# Patient Record
Sex: Female | Born: 1935 | Race: White | Hispanic: No | Smoking: Former smoker
Health system: Southern US, Community
[De-identification: ages and names within clinical notes are randomized; demographics above are authoritative.]

## PROBLEM LIST (undated history)

## (undated) DIAGNOSIS — F32A Depression, unspecified: Secondary | ICD-10-CM

## (undated) DIAGNOSIS — J690 Pneumonitis due to inhalation of food and vomit: Secondary | ICD-10-CM

## (undated) DIAGNOSIS — R079 Chest pain, unspecified: Secondary | ICD-10-CM

## (undated) DIAGNOSIS — G9340 Encephalopathy, unspecified: Secondary | ICD-10-CM

## (undated) DIAGNOSIS — H9190 Unspecified hearing loss, unspecified ear: Secondary | ICD-10-CM

## (undated) DIAGNOSIS — I10 Essential (primary) hypertension: Secondary | ICD-10-CM

## (undated) DIAGNOSIS — F329 Major depressive disorder, single episode, unspecified: Secondary | ICD-10-CM

## (undated) DIAGNOSIS — C801 Malignant (primary) neoplasm, unspecified: Secondary | ICD-10-CM

## (undated) DIAGNOSIS — H548 Legal blindness, as defined in USA: Secondary | ICD-10-CM

## (undated) HISTORY — PX: SHOULDER SURGERY: SHX246

## (undated) HISTORY — PX: ABDOMINAL HYSTERECTOMY: SHX81

---

## 2017-06-09 ENCOUNTER — Inpatient Hospital Stay (HOSPITAL_BASED_OUTPATIENT_CLINIC_OR_DEPARTMENT_OTHER)
Admission: EM | Admit: 2017-06-09 | Discharge: 2017-06-12 | DRG: 070 | Disposition: A | Discharge status: Skilled Nursing Facility | Payer: Medicare Other | Attending: Nephrology | Admitting: Nephrology

## 2017-06-09 ENCOUNTER — Emergency Department (HOSPITAL_BASED_OUTPATIENT_CLINIC_OR_DEPARTMENT_OTHER): Payer: Medicare Other

## 2017-06-09 ENCOUNTER — Encounter (HOSPITAL_BASED_OUTPATIENT_CLINIC_OR_DEPARTMENT_OTHER): Payer: Self-pay | Admitting: Emergency Medicine

## 2017-06-09 ENCOUNTER — Other Ambulatory Visit: Payer: Self-pay

## 2017-06-09 DIAGNOSIS — R4182 Altered mental status, unspecified: Secondary | ICD-10-CM

## 2017-06-09 DIAGNOSIS — J690 Pneumonitis due to inhalation of food and vomit: Secondary | ICD-10-CM

## 2017-06-09 DIAGNOSIS — F329 Major depressive disorder, single episode, unspecified: Secondary | ICD-10-CM | POA: Diagnosis present

## 2017-06-09 DIAGNOSIS — I10 Essential (primary) hypertension: Secondary | ICD-10-CM | POA: Diagnosis present

## 2017-06-09 DIAGNOSIS — H532 Diplopia: Secondary | ICD-10-CM | POA: Diagnosis not present

## 2017-06-09 DIAGNOSIS — R0602 Shortness of breath: Secondary | ICD-10-CM

## 2017-06-09 DIAGNOSIS — G9341 Metabolic encephalopathy: Secondary | ICD-10-CM | POA: Diagnosis not present

## 2017-06-09 DIAGNOSIS — R410 Disorientation, unspecified: Secondary | ICD-10-CM | POA: Diagnosis present

## 2017-06-09 DIAGNOSIS — Z79899 Other long term (current) drug therapy: Secondary | ICD-10-CM

## 2017-06-09 DIAGNOSIS — R440 Auditory hallucinations: Secondary | ICD-10-CM | POA: Diagnosis present

## 2017-06-09 DIAGNOSIS — Z87891 Personal history of nicotine dependence: Secondary | ICD-10-CM

## 2017-06-09 DIAGNOSIS — N390 Urinary tract infection, site not specified: Secondary | ICD-10-CM | POA: Diagnosis present

## 2017-06-09 DIAGNOSIS — N3000 Acute cystitis without hematuria: Secondary | ICD-10-CM

## 2017-06-09 DIAGNOSIS — Z66 Do not resuscitate: Secondary | ICD-10-CM | POA: Diagnosis not present

## 2017-06-09 DIAGNOSIS — Z8673 Personal history of transient ischemic attack (TIA), and cerebral infarction without residual deficits: Secondary | ICD-10-CM | POA: Diagnosis not present

## 2017-06-09 DIAGNOSIS — R441 Visual hallucinations: Secondary | ICD-10-CM | POA: Diagnosis not present

## 2017-06-09 DIAGNOSIS — R8271 Bacteriuria: Secondary | ICD-10-CM | POA: Diagnosis present

## 2017-06-09 DIAGNOSIS — R2689 Other abnormalities of gait and mobility: Secondary | ICD-10-CM | POA: Diagnosis present

## 2017-06-09 DIAGNOSIS — F419 Anxiety disorder, unspecified: Secondary | ICD-10-CM | POA: Diagnosis present

## 2017-06-09 DIAGNOSIS — G319 Degenerative disease of nervous system, unspecified: Secondary | ICD-10-CM | POA: Diagnosis not present

## 2017-06-09 DIAGNOSIS — E876 Hypokalemia: Secondary | ICD-10-CM | POA: Diagnosis not present

## 2017-06-09 DIAGNOSIS — F32A Depression, unspecified: Secondary | ICD-10-CM

## 2017-06-09 HISTORY — DX: Malignant (primary) neoplasm, unspecified: C80.1

## 2017-06-09 HISTORY — DX: Essential (primary) hypertension: I10

## 2017-06-09 LAB — COMPREHENSIVE METABOLIC PANEL
ALT: 12 U/L — ABNORMAL LOW (ref 14–54)
AST: 29 U/L (ref 15–41)
Albumin: 4.1 g/dL (ref 3.5–5.0)
Alkaline Phosphatase: 50 U/L (ref 38–126)
Anion gap: 9 (ref 5–15)
BUN: 13 mg/dL (ref 6–20)
CO2: 27 mmol/L (ref 22–32)
Calcium: 9.2 mg/dL (ref 8.9–10.3)
Chloride: 100 mmol/L — ABNORMAL LOW (ref 101–111)
Creatinine, Ser: 0.91 mg/dL (ref 0.44–1.00)
GFR calc Af Amer: 59 mL/min — ABNORMAL LOW (ref >60)
GFR calc non Af Amer: 51 mL/min — ABNORMAL LOW (ref >60)
Glucose, Bld: 149 mg/dL — ABNORMAL HIGH (ref 65–99)
Potassium: 4 mmol/L (ref 3.5–5.1)
Sodium: 136 mmol/L (ref 135–145)
Total Bilirubin: 0.6 mg/dL (ref 0.3–1.2)
Total Protein: 7.8 g/dL (ref 6.5–8.1)

## 2017-06-09 LAB — CBC WITH DIFFERENTIAL/PLATELET
Basophils Absolute: 0.1 10*3/uL (ref 0.0–0.1)
Basophils Relative: 1 %
Eosinophils Absolute: 0.1 10*3/uL (ref 0.0–0.7)
Eosinophils Relative: 1 %
HCT: 30 % — ABNORMAL LOW (ref 36.0–46.0)
Hemoglobin: 9 g/dL — ABNORMAL LOW (ref 12.0–15.0)
Lymphocytes Relative: 20 %
Lymphs Abs: 1.7 10*3/uL (ref 0.7–4.0)
MCH: 25.6 pg — ABNORMAL LOW (ref 26.0–34.0)
MCHC: 30 g/dL (ref 30.0–36.0)
MCV: 85.2 fL (ref 78.0–100.0)
Monocytes Absolute: 1.1 10*3/uL — ABNORMAL HIGH (ref 0.1–1.0)
Monocytes Relative: 13 %
Neutro Abs: 5.8 10*3/uL (ref 1.7–7.7)
Neutrophils Relative %: 65 %
Platelets: 258 10*3/uL (ref 150–400)
RBC: 3.52 MIL/uL — ABNORMAL LOW (ref 3.87–5.11)
RDW: 14.9 % (ref 11.5–15.5)
WBC: 8.8 10*3/uL (ref 4.0–10.5)

## 2017-06-09 LAB — CBC
HCT: 27.8 % — ABNORMAL LOW (ref 36.0–46.0)
Hemoglobin: 8.4 g/dL — ABNORMAL LOW (ref 12.0–15.0)
MCH: 25.6 pg — AB (ref 26.0–34.0)
MCHC: 30.2 g/dL (ref 30.0–36.0)
MCV: 84.8 fL (ref 78.0–100.0)
PLATELETS: 216 10*3/uL (ref 150–400)
RBC: 3.28 MIL/uL — AB (ref 3.87–5.11)
RDW: 14.9 % (ref 11.5–15.5)
WBC: 6.9 10*3/uL (ref 4.0–10.5)

## 2017-06-09 LAB — URINALYSIS, MICROSCOPIC (REFLEX)

## 2017-06-09 LAB — CREATININE, SERUM
Creatinine, Ser: 0.76 mg/dL (ref 0.44–1.00)
GFR calc Af Amer: >60 mL/min (ref >60)
GFR calc non Af Amer: >60 mL/min (ref >60)

## 2017-06-09 LAB — URINALYSIS, ROUTINE W REFLEX MICROSCOPIC
Bilirubin Urine: NEGATIVE
GLUCOSE, UA: NEGATIVE mg/dL
Hgb urine dipstick: NEGATIVE
Ketones, ur: NEGATIVE mg/dL
Nitrite: NEGATIVE
PH: 7 (ref 5.0–8.0)
PROTEIN: 30 mg/dL — AB
SPECIFIC GRAVITY, URINE: 1.01 (ref 1.005–1.030)

## 2017-06-09 LAB — TROPONIN I

## 2017-06-09 MED ORDER — ATENOLOL 25 MG PO TABS
12.5000 mg | ORAL_TABLET | Freq: Every day | ORAL | Status: DC
  Administered 2017-06-09 – 2017-06-11 (×3): 12.5 mg via ORAL
  Filled 2017-06-09 (×3): qty 1

## 2017-06-09 MED ORDER — SODIUM CHLORIDE 0.9 % IV SOLN
1.0000 g | INTRAVENOUS | Status: DC
  Administered 2017-06-10 – 2017-06-11 (×2): 1 g via INTRAVENOUS
  Filled 2017-06-09 (×2): qty 1

## 2017-06-09 MED ORDER — ATENOLOL 25 MG PO TABS: 12.5000 mg | ORAL_TABLET | Freq: Two times a day (BID) | ORAL | Status: DC

## 2017-06-09 MED ORDER — HEPARIN SODIUM (PORCINE) 5000 UNIT/ML IJ SOLN
5000.0000 [IU] | Freq: Three times a day (TID) | INTRAMUSCULAR | Status: DC
  Administered 2017-06-09 – 2017-06-11 (×5): 5000 [IU] via SUBCUTANEOUS
  Filled 2017-06-09 (×5): qty 1

## 2017-06-09 MED ORDER — CITALOPRAM HYDROBROMIDE 20 MG PO TABS
20.0000 mg | ORAL_TABLET | Freq: Every evening | ORAL | Status: DC
  Administered 2017-06-09 – 2017-06-11 (×3): 20 mg via ORAL
  Filled 2017-06-09 (×3): qty 1

## 2017-06-09 MED ORDER — SODIUM CHLORIDE 0.9 % IV SOLN
INTRAVENOUS | Status: DC
  Administered 2017-06-09 – 2017-06-10 (×2): via INTRAVENOUS

## 2017-06-09 MED ORDER — HYDRALAZINE HCL 20 MG/ML IJ SOLN: 5.0000 mg | Freq: Four times a day (QID) | INTRAMUSCULAR | Status: DC | PRN

## 2017-06-09 MED ORDER — ATENOLOL 25 MG PO TABS
25.0000 mg | ORAL_TABLET | Freq: Every day | ORAL | Status: DC
  Administered 2017-06-09 – 2017-06-12 (×4): 25 mg via ORAL
  Filled 2017-06-09 (×4): qty 1

## 2017-06-09 MED ORDER — SODIUM CHLORIDE 0.9 % IV SOLN
1.0000 g | Freq: Once | INTRAVENOUS | Status: AC
  Administered 2017-06-09: 1 g via INTRAVENOUS
  Filled 2017-06-09: qty 10

## 2017-06-09 MED ORDER — LISINOPRIL 20 MG PO TABS
20.0000 mg | ORAL_TABLET | Freq: Every evening | ORAL | Status: DC
  Administered 2017-06-09 – 2017-06-10 (×2): 20 mg via ORAL
  Filled 2017-06-09 (×3): qty 1

## 2017-06-09 MED ORDER — TRAZODONE HCL 100 MG PO TABS
100.0000 mg | ORAL_TABLET | Freq: Every day | ORAL | Status: DC
  Administered 2017-06-09 – 2017-06-11 (×3): 100 mg via ORAL
  Filled 2017-06-09 (×3): qty 1

## 2017-06-09 NOTE — ED Notes (Signed)
ED Provider at bedside. 

## 2017-06-09 NOTE — ED Notes (Signed)
Patient transported to CT 

## 2017-06-09 NOTE — H&P (Addendum)
History and Physical    Emily Hardin VEL:[id number] DOB: [birth year] DOA: 06/09/2017  PCP: Patient, No Pcp Per   Patient coming from: Home    Chief Complaint: Confusion  HPI: Emily Hardin is a 84 y.o. female with medical history significant of hypertension, depression who was sent to from her home for the evaluation of [facility]  for the evaluation of confusion.  Patient is a 84 year old but generally she is pretty sharp.  She walks with the help of walker .She has history of hypertension and takes medications at home and follows with her PCP.  History was obtained from patient and her granddaughter.  Patient lives with her granddaughter.  As per the granddaughter, patient has been increasingly confused since last couple of days.  She was found to be hallucinating.  She was hearing or things and talking to unseen people.  Patient was then taken to [facility] where she was found to have urinary tract infection as per the urinalysis.  Patient was then sent to [facility] for direct admission. There was no report of fever or chills.  Patient denies any abdominal pain or dysuria.  She does not complain of any chest pain, shortness of breath, palpitation, nausea, vomiting, headache. Patient seen and examined the bedside. She is a very pleasant elderly female and looks very comfortable.  She is alert and oriented.  Her mental status has improved and is close to her baseline.  She denies any problems at present.    ED Course: UA was suggestive of UTI.  She was given a dose of ceftriaxone.  Patient was also found to be hypertensive.  Review of Systems: As per HPI otherwise 10 point review of systems negative.    Past Medical History:  Diagnosis Date  . Cancer ([place])   . Hypertension     Past Surgical History:  Procedure Laterality Date  . ABDOMINAL HYSTERECTOMY    . CESAREAN SECTION       reports that she quit smoking about 30 years ago.  She has never used smokeless tobacco. She reports that she drinks about 0.6 oz of alcohol per week. She reports that she does not use drugs.  No Known Allergies  No family history on file.   Prior to Admission medications   Medication Sig Start Date End Date Taking? Authorizing Provider  citalopram (CELEXA) 20 MG tablet Take 20 mg by mouth every evening.    Yes [provider]  lisinopril (PRINIVIL,ZESTRIL) 20 MG tablet Take 20 mg by mouth every evening.    Yes [provider]  traZODone (DESYREL) 100 MG tablet Take 100 mg by mouth 2 (two) times daily.   Yes [provider]  atenolol (TENORMIN) 25 MG tablet Take 12.5-25 mg by mouth 2 (two) times daily. Take 1 tablet (25 mg) in the am and Take 0.5 tablet (12.5 mg) at bedtime.    [provider]    Physical Exam: Vitals:   06/09/17 1002 06/09/17 1013 06/09/17 1415 06/09/17 1537  BP: (!) 162/68  (!) 192/72 (!) 184/62  Pulse: 68  68 68  Resp: 18  18 16   Temp: 97.8 F (36.6 C)   98.2 F (36.8 C)  TempSrc: Oral   Oral  SpO2: 95%  92% 94%  Weight:  72.6 kg (160 lb)    Height:  5\' 2"  (1.575 m)      Constitutional: NAD, calm, comfortable Vitals:   06/09/17 1002 06/09/17 1013 06/09/17 1415 06/09/17  1537  BP: (!) 162/68  (!) 192/72 (!) 184/62  Pulse: 68  68 68  Resp: 18  18 16   Temp: 97.8 F (36.6 C)   98.2 F (36.8 C)  TempSrc: Oral   Oral  SpO2: 95%  92% 94%  Weight:  72.6 kg (160 lb)    Height:  5\' 2"  (1.575 m)     Eyes: PERRL, lids and conjunctivae normal ENMT: Mucous membranes are moist. Posterior pharynx clear of any exudate or lesions.Normal dentition.  Neck: normal, supple, no masses, no thyromegaly Respiratory: clear to auscultation bilaterally, no wheezing, no crackles. Normal respiratory effort. No accessory muscle use.  Cardiovascular: Regular rate and rhythm, no murmurs / rubs / gallops. No extremity edema. 2+ pedal pulses. No carotid bruits.  Abdomen: no tenderness, no masses  palpated. No hepatosplenomegaly. Bowel sounds positive.  Musculoskeletal: no clubbing / cyanosis. No joint deformity upper and lower extremities. Good ROM, no contractures. Normal muscle tone.  Skin: no rashes, lesions, ulcers. No induration Neurologic: CN 2-12 grossly intact. Sensation intact, DTR normal. Strength 5/5 in all 4.  Psychiatric: Normal judgment and insight. Alert and oriented x 3. Normal mood.   Foley Catheter:None  Labs on Admission: I have personally reviewed following labs and imaging studies  CBC: Recent Labs  Lab 06/09/17 1041  WBC 8.8  NEUTROABS 5.8  HGB 9.0*  HCT 30.0*  MCV 85.2  PLT 937   Basic Metabolic Panel: Recent Labs  Lab 06/09/17 1041  NA 136  K 4.0  CL 100*  CO2 27  GLUCOSE 149*  BUN 13  CREATININE 0.91  CALCIUM 9.2   GFR: Estimated Creatinine Clearance: 32.2 mL/min (by C-G formula based on SCr of 0.91 mg/dL). Liver Function Tests: Recent Labs  Lab 06/09/17 1041  AST 29  ALT 12*  ALKPHOS 50  BILITOT 0.6  PROT 7.8  ALBUMIN 4.1   No results for input(s): LIPASE, AMYLASE in the last 168 hours. No results for input(s): AMMONIA in the last 168 hours. Coagulation Profile: No results for input(s): INR, PROTIME in the last 168 hours. Cardiac Enzymes: Recent Labs  Lab 06/09/17 1042  TROPONINI <0.03   BNP (last 3 results) No results for input(s): PROBNP in the last 8760 hours. HbA1C: No results for input(s): HGBA1C in the last 72 hours. CBG: No results for input(s): GLUCAP in the last 168 hours. Lipid Profile: No results for input(s): CHOL, HDL, LDLCALC, TRIG, CHOLHDL, LDLDIRECT in the last 72 hours. Thyroid Function Tests: No results for input(s): TSH, T4TOTAL, FREET4, T3FREE, THYROIDAB in the last 72 hours. Anemia Panel: No results for input(s): VITAMINB12, FOLATE, FERRITIN, TIBC, IRON, RETICCTPCT in the last 72 hours. Urine analysis:    Component Value Date/Time   COLORURINE YELLOW 06/09/2017 1101   APPEARANCEUR CLEAR  06/09/2017 1101   LABSPEC 1.010 06/09/2017 1101   PHURINE 7.0 06/09/2017 1101   GLUCOSEU NEGATIVE 06/09/2017 1101   HGBUR NEGATIVE 06/09/2017 1101   BILIRUBINUR NEGATIVE 06/09/2017 1101   KETONESUR NEGATIVE 06/09/2017 1101   PROTEINUR 30 (A) 06/09/2017 1101   NITRITE NEGATIVE 06/09/2017 1101   LEUKOCYTESUR MODERATE (A) 06/09/2017 1101    Radiological Exams on Admission: Dg Chest 2 View  Result Date: 06/09/2017 CLINICAL DATA:  Confusion. EXAM: CHEST - 2 VIEW COMPARISON:  None. FINDINGS: The heart is mildly enlarged. There is tortuosity and calcification of the thoracic aorta. The pulmonary hila appear normal. The lungs are clear. Small pleural effusions are possible. The bony thorax is intact. IMPRESSION: Mild cardiac enlargement and  tortuous calcified thoracic aorta. Possible small pleural effusions but no infiltrates or edema. Electronically Signed   By: Marijo Sanes M.D.   On: 06/09/2017 11:14   Ct Head Wo Contrast  Result Date: 06/09/2017 CLINICAL DATA:  Confusion EXAM: CT HEAD WITHOUT CONTRAST TECHNIQUE: Contiguous axial images were obtained from the base of the skull through the vertex without intravenous contrast. COMPARISON:  None. FINDINGS: Brain: There is moderate diffuse atrophy. There is no intracranial mass, hemorrhage, extra-axial fluid collection, or midline shift. There is small vessel disease throughout the centra semiovale bilaterally. There is evidence of a prior small infarct in the head of the caudate nucleus on the left. There is small vessel disease in each internal and external capsule. There is evidence of a prior small infarct in the periphery of the mid left cerebellum. No acute infarct is evident. Vascular: There is no appreciable hyperdense vessel. There is calcification in each carotid siphon and distal vertebral artery. Skull: The bony calvarium appears intact. Sinuses/Orbits: There is mucosal thickening in several ethmoid air cells. Other visualized paranasal  sinuses are clear. Visualized orbits appear symmetric bilaterally. Other: There is slight opacification in inferior mastoid air cell on the left. Mastoids elsewhere clear. IMPRESSION: Atrophy with extensive supratentorial small vessel disease. 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 No acute infarct evident. No mass or hemorrhage. There are foci of arterial vascular calcification. There is mild ethmoid sinus disease bilaterally as well as opacification in inferior posterior left mastoid air cell. Electronically Signed   By: Lowella Grip III M.D.   On: 06/09/2017 10:58     Assessment/Plan Principal Problem:   UTI (urinary tract infection) Active Problems:   HTN (hypertension)   Depression  Urinary tract infection: Urinalysis was positive of UTI.  Started on ceftriaxone.  We will follow-up urine culture.  Continue antibiotics.  Patient is afebrile.  Her white cell counts are stable.  Confusion/hallucination: Most likely associated with UTI.  Currently mental status is close to baseline.  She is alert and oriented and answers most of the questions.    Hypertension:Noted to be hypertensive on presentation.  Will resume her home medications.  Blood pressure was high most likely because she missed her medications.We  will also continue as needed Hydralazine . Continue to monitor the blood pressure.    History of depression: On Celexa and trazodone.We will continue.   Severity of Illness: The appropriate patient status for this patient is OBSERVATION. Observation status is judged to be reasonable and necessary in order to provide the required intensity of service to ensure the patient's safety. The patient's presenting symptoms, physical exam findings, and initial radiographic and laboratory data in the context of their medical condition is felt to place them at decreased risk for further clinical deterioration. Furthermore, it is anticipated  that the patient will be medically stable for discharge from the hospital within 2 midnights of admission. The following factors support the patient status of observation.     Please Note: This patient record was dictated using Editor, commissioning. Chart creation errors have been sought, but may not always have been located. Such creation errors do not reflect on the Standard of Medical Care.   DVT prophylaxis: Heparin [place] Code Status: DNR Family Communication: Discussed with the granddaughter on phone Consults called: None     Shelly Coss MD [facility] Pager [phone]  If 7PM-7AM, please contact night-coverage [email or web address] Password [facility]  06/09/2017, 5:24 PM

## 2017-06-09 NOTE — ED Provider Notes (Signed)
[place] EMERGENCY DEPARTMENT Provider Note   CSN: [id number] Arrival date & time: 06/09/17  6967     History   Chief Complaint Chief Complaint  Patient presents with  . Altered Mental Status    HPI Fionnuala [name] is a 84 y.o. female.  The history is provided by the patient.  Altered Mental Status   This is a new problem. The current episode started 2 days ago. The problem has not changed since onset.Associated symptoms include confusion and agitation. Pertinent negatives include no unresponsiveness and no weakness. Risk factors include a recent infection. Her past medical history does not include seizures or AIDS.    Past Medical History:  Diagnosis Date  . Cancer ([place])   . Hypertension     There are no active problems to display for this patient.   Past Surgical History:  Procedure Laterality Date  . ABDOMINAL HYSTERECTOMY    . CESAREAN SECTION       OB History   None      Home Medications    Prior to Admission medications   Medication Sig Start Date End Date Taking? Authorizing Provider  atenolol (TENORMIN) 25 MG tablet Take by mouth 2 (two) times daily.   Yes [provider]  citalopram (CELEXA) 20 MG tablet Take 20 mg by mouth daily.   Yes [provider]  lisinopril (PRINIVIL,ZESTRIL) 20 MG tablet Take 20 mg by mouth daily.   Yes [provider]  traZODone (DESYREL) 100 MG tablet Take 100 mg by mouth 2 (two) times daily.   Yes [provider]    Family History No family history on file.  Social History Social History   Tobacco Use  . Smoking status: Former Smoker    Last attempt to quit: 06/10/1987    Years since quitting: 30.0  . Smokeless tobacco: Never Used  Substance Use Topics  . Alcohol use: Yes    Alcohol/week: 0.6 oz    Types: 1 Glasses of wine per week    Comment: 1 glass wine daily  . Drug use: Never     Allergies   Patient has no known allergies.   Review of  Systems Review of Systems  Constitutional: Negative for diaphoresis and fever.  HENT: Negative for drooling, sore throat and trouble swallowing.   Respiratory: Negative for cough and shortness of breath.   Cardiovascular: Negative for chest pain.  Gastrointestinal: Negative for abdominal pain, nausea and vomiting.  Genitourinary: Negative for flank pain.  Musculoskeletal: Negative for neck pain and neck stiffness.  Neurological: Negative for facial asymmetry, speech difficulty, weakness and headaches.  Psychiatric/Behavioral: Positive for agitation and confusion.  All other systems reviewed and are negative.    Physical Exam Updated Vital Signs BP (!) 162/68 (BP Location: Right Arm)   Pulse 68   Temp 97.8 F (36.6 C) (Oral)   Resp 18   Ht 5\' 2"  (1.575 m)   Wt 72.6 kg (160 lb)   SpO2 95%   BMI 29.26 kg/m   Physical Exam  Constitutional: She appears well-developed and well-nourished. No distress.  HENT:  Head: Normocephalic and atraumatic.  Mouth/Throat: No oropharyngeal exudate.  Eyes: Pupils are equal, round, and reactive to light. Conjunctivae are normal.  Neck: Normal range of motion. Neck supple.  Cardiovascular: Normal rate, regular rhythm, normal heart sounds and intact distal pulses.  Pulmonary/Chest: Effort normal and breath sounds normal. No stridor. She has no wheezes. She has no rales.  Abdominal: Soft. Bowel sounds are normal.  She exhibits no mass. There is no tenderness. There is no rebound and no guarding.  Musculoskeletal: Normal range of motion.  Neurological: She is alert. She displays normal reflexes. No cranial nerve deficit.  Skin: Skin is warm and dry. Capillary refill takes less than 2 seconds.  Psychiatric: She has a normal mood and affect.     ED Treatments / Results  Labs (all labs ordered are listed, but only abnormal results are displayed)  Results for orders placed or performed during the hospital encounter of 06/09/17  CBC with  Differential/Platelet  Result Value Ref Range   WBC 8.8 4.0 - 10.5 K/uL   RBC 3.52 (L) 3.87 - 5.11 MIL/uL   Hemoglobin 9.0 (L) 12.0 - 15.0 g/dL   HCT 30.0 (L) 36.0 - 46.0 %   MCV 85.2 78.0 - 100.0 fL   MCH 25.6 (L) 26.0 - 34.0 pg   MCHC 30.0 30.0 - 36.0 g/dL   RDW 14.9 11.5 - 15.5 %   Platelets 258 150 - 400 K/uL   Neutrophils Relative % 65 %   Neutro Abs 5.8 1.7 - 7.7 K/uL   Lymphocytes Relative 20 %   Lymphs Abs 1.7 0.7 - 4.0 K/uL   Monocytes Relative 13 %   Monocytes Absolute 1.1 (H) 0.1 - 1.0 K/uL   Eosinophils Relative 1 %   Eosinophils Absolute 0.1 0.0 - 0.7 K/uL   Basophils Relative 1 %   Basophils Absolute 0.1 0.0 - 0.1 K/uL  Comprehensive metabolic panel  Result Value Ref Range   Sodium 136 135 - 145 mmol/L   Potassium 4.0 3.5 - 5.1 mmol/L   Chloride 100 (L) 101 - 111 mmol/L   CO2 27 22 - 32 mmol/L   Glucose, Bld 149 (H) 65 - 99 mg/dL   BUN 13 6 - 20 mg/dL   Creatinine, Ser 0.91 0.44 - 1.00 mg/dL   Calcium 9.2 8.9 - 10.3 mg/dL   Total Protein 7.8 6.5 - 8.1 g/dL   Albumin 4.1 3.5 - 5.0 g/dL   AST 29 15 - 41 U/L   ALT 12 (L) 14 - 54 U/L   Alkaline Phosphatase 50 38 - 126 U/L   Total Bilirubin 0.6 0.3 - 1.2 mg/dL   GFR calc non Af Amer 51 (L) >60 mL/min   GFR calc Af Amer 59 (L) >60 mL/min   Anion gap 9 5 - 15  Troponin I  Result Value Ref Range   Troponin I <0.03 <0.03 ng/mL  Urinalysis, Routine w reflex microscopic  Result Value Ref Range   Color, Urine YELLOW YELLOW   APPearance CLEAR CLEAR   Specific Gravity, Urine 1.010 1.005 - 1.030   pH 7.0 5.0 - 8.0   Glucose, UA NEGATIVE NEGATIVE mg/dL   Hgb urine dipstick NEGATIVE NEGATIVE   Bilirubin Urine NEGATIVE NEGATIVE   Ketones, ur NEGATIVE NEGATIVE mg/dL   Protein, ur 30 (A) NEGATIVE mg/dL   Nitrite NEGATIVE NEGATIVE   Leukocytes, UA MODERATE (A) NEGATIVE  Urinalysis, Microscopic (reflex)  Result Value Ref Range   RBC / HPF 0-5 0 - 5 RBC/hpf   WBC, UA 6-30 0 - 5 WBC/hpf   Bacteria, UA RARE (A) NONE  SEEN   Squamous Epithelial / LPF 0-5 (A) NONE SEEN   Mucus PRESENT    Dg Chest 2 View  Result Date: 06/09/2017 CLINICAL DATA:  Confusion. EXAM: CHEST - 2 VIEW COMPARISON:  None. FINDINGS: The heart is mildly enlarged. There is tortuosity and calcification of the thoracic  aorta. The pulmonary hila appear normal. The lungs are clear. Small pleural effusions are possible. The bony thorax is intact. IMPRESSION: Mild cardiac enlargement and tortuous calcified thoracic aorta. Possible small pleural effusions but no infiltrates or edema. Electronically Signed   By: Marijo Sanes M.D.   On: 06/09/2017 11:14   Ct Head Wo Contrast  Result Date: 06/09/2017 CLINICAL DATA:  Confusion EXAM: CT HEAD WITHOUT CONTRAST TECHNIQUE: Contiguous axial images were obtained from the base of the skull through the vertex without intravenous contrast. COMPARISON:  None. FINDINGS: Brain: There is moderate diffuse atrophy. There is no intracranial mass, hemorrhage, extra-axial fluid collection, or midline shift. There is small vessel disease throughout the centra semiovale bilaterally. There is evidence of a prior small infarct in the head of the caudate nucleus on the left. There is small vessel disease in each internal and external capsule. There is evidence of a prior small infarct in the periphery of the mid left cerebellum. No acute infarct is evident. Vascular: There is no appreciable hyperdense vessel. There is calcification in each carotid siphon and distal vertebral artery. Skull: The bony calvarium appears intact. Sinuses/Orbits: There is mucosal thickening in several ethmoid air cells. Other visualized paranasal sinuses are clear. Visualized orbits appear symmetric bilaterally. Other: There is slight opacification in inferior mastoid air cell on the left. Mastoids elsewhere clear. IMPRESSION: Atrophy with extensive supratentorial small vessel disease. Prior small infarcts in the mid left cerebellum and head of the caudate  nucleus on the left. No acute infarct evident. No mass or hemorrhage. There are foci of arterial vascular calcification. There is mild ethmoid sinus disease bilaterally as well as opacification in inferior posterior left mastoid air cell. Electronically Signed   By: Lowella Grip III M.D.   On: 06/09/2017 10:58    EKG EKG Interpretation  Date/Time:  Monday June 09 2017 11:07:59 EDT Ventricular Rate:  70 PR Interval:    QRS Duration: 95 QT Interval:  422 QTC Calculation: 456 R Axis:   55 Text Interpretation:  Sinus rhythm Prolonged PR interval Confirmed by Dory Horn) on 06/09/2017 11:23:57 AM   Radiology Dg Chest 2 View  Result Date: 06/09/2017 CLINICAL DATA:  Confusion. EXAM: CHEST - 2 VIEW COMPARISON:  None. FINDINGS: The heart is mildly enlarged. There is tortuosity and calcification of the thoracic aorta. The pulmonary hila appear normal. The lungs are clear. Small pleural effusions are possible. The bony thorax is intact. IMPRESSION: Mild cardiac enlargement and tortuous calcified thoracic aorta. Possible small pleural effusions but no infiltrates or edema. Electronically Signed   By: Marijo Sanes M.D.   On: 06/09/2017 11:14   Ct Head Wo Contrast  Result Date: 06/09/2017 CLINICAL DATA:  Confusion EXAM: CT HEAD WITHOUT CONTRAST TECHNIQUE: Contiguous axial images were obtained from the base of the skull through the vertex without intravenous contrast. COMPARISON:  None. FINDINGS: Brain: There is moderate diffuse atrophy. There is no intracranial mass, hemorrhage, extra-axial fluid collection, or midline shift. There is small vessel disease throughout the centra semiovale bilaterally. There is evidence of a prior small infarct in the head of the caudate nucleus on the left. There is small vessel disease in each internal and external capsule. There is evidence of a prior small infarct in the periphery of the mid left cerebellum. No acute infarct is evident. Vascular: There is  no appreciable hyperdense vessel. There is calcification in each carotid siphon and distal vertebral artery. Skull: The bony calvarium appears intact. Sinuses/Orbits: There is mucosal thickening  in several ethmoid air cells. Other visualized paranasal sinuses are clear. Visualized orbits appear symmetric bilaterally. Other: There is slight opacification in inferior mastoid air cell on the left. Mastoids elsewhere clear. IMPRESSION: Atrophy with extensive supratentorial small vessel disease. 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 No acute infarct evident. No mass or hemorrhage. There are foci of arterial vascular calcification. There is mild ethmoid sinus disease bilaterally as well as opacification in inferior posterior left mastoid air cell. Electronically Signed   By: Lowella Grip III M.D.   On: 06/09/2017 10:58    Procedures Procedures (including critical care time)  Medications Ordered in ED Medications  0.9 %  sodium chloride infusion (has no administration in time range)  cefTRIAXone (ROCEPHIN) 1 g in sodium chloride 0.9 % 100 mL IVPB (has no administration in time range)       Final Clinical Impressions(s) / ED Diagnoses   Final diagnoses:  Altered mental status, unspecified altered mental status type  Acute cystitis without hematuria    AMS with UTI, will give IVF and IV abx and admit   Narcissus Detwiler, MD 06/09/17 1211

## 2017-06-09 NOTE — ED Triage Notes (Signed)
Pt confused since yesterday.  Recalling events that did not happen.  Last known well 3/23. Denies any pain. Family sts that normally pt is completely lucid and oriented to reality.

## 2017-06-10 ENCOUNTER — Observation Stay (HOSPITAL_COMMUNITY): Payer: Medicare Other

## 2017-06-10 DIAGNOSIS — F419 Anxiety disorder, unspecified: Secondary | ICD-10-CM | POA: Diagnosis present

## 2017-06-10 DIAGNOSIS — I10 Essential (primary) hypertension: Secondary | ICD-10-CM | POA: Diagnosis present

## 2017-06-10 DIAGNOSIS — F329 Major depressive disorder, single episode, unspecified: Secondary | ICD-10-CM | POA: Diagnosis present

## 2017-06-10 DIAGNOSIS — Z66 Do not resuscitate: Secondary | ICD-10-CM | POA: Diagnosis present

## 2017-06-10 DIAGNOSIS — R440 Auditory hallucinations: Secondary | ICD-10-CM | POA: Diagnosis present

## 2017-06-10 DIAGNOSIS — R441 Visual hallucinations: Secondary | ICD-10-CM | POA: Diagnosis present

## 2017-06-10 DIAGNOSIS — R410 Disorientation, unspecified: Secondary | ICD-10-CM | POA: Diagnosis present

## 2017-06-10 DIAGNOSIS — Z79899 Other long term (current) drug therapy: Secondary | ICD-10-CM | POA: Diagnosis not present

## 2017-06-10 DIAGNOSIS — H532 Diplopia: Secondary | ICD-10-CM | POA: Diagnosis present

## 2017-06-10 DIAGNOSIS — N3000 Acute cystitis without hematuria: Secondary | ICD-10-CM | POA: Diagnosis present

## 2017-06-10 DIAGNOSIS — R4182 Altered mental status, unspecified: Secondary | ICD-10-CM | POA: Diagnosis not present

## 2017-06-10 DIAGNOSIS — R2689 Other abnormalities of gait and mobility: Secondary | ICD-10-CM | POA: Diagnosis present

## 2017-06-10 DIAGNOSIS — Z8673 Personal history of transient ischemic attack (TIA), and cerebral infarction without residual deficits: Secondary | ICD-10-CM | POA: Diagnosis not present

## 2017-06-10 DIAGNOSIS — G9341 Metabolic encephalopathy: Secondary | ICD-10-CM | POA: Diagnosis present

## 2017-06-10 DIAGNOSIS — R8271 Bacteriuria: Secondary | ICD-10-CM | POA: Diagnosis present

## 2017-06-10 DIAGNOSIS — J690 Pneumonitis due to inhalation of food and vomit: Secondary | ICD-10-CM | POA: Diagnosis not present

## 2017-06-10 DIAGNOSIS — E876 Hypokalemia: Secondary | ICD-10-CM | POA: Diagnosis not present

## 2017-06-10 DIAGNOSIS — G319 Degenerative disease of nervous system, unspecified: Secondary | ICD-10-CM | POA: Diagnosis present

## 2017-06-10 DIAGNOSIS — Z87891 Personal history of nicotine dependence: Secondary | ICD-10-CM | POA: Diagnosis not present

## 2017-06-10 LAB — COMPREHENSIVE METABOLIC PANEL
ALBUMIN: 3 g/dL — AB (ref 3.5–5.0)
ALT: 8 U/L — ABNORMAL LOW (ref 14–54)
AST: 17 U/L (ref 15–41)
Alkaline Phosphatase: 40 U/L (ref 38–126)
Anion gap: 7 (ref 5–15)
BILIRUBIN TOTAL: 0.3 mg/dL (ref 0.3–1.2)
BUN: 8 mg/dL (ref 6–20)
CO2: 27 mmol/L (ref 22–32)
Calcium: 8.5 mg/dL — ABNORMAL LOW (ref 8.9–10.3)
Chloride: 106 mmol/L (ref 101–111)
Creatinine, Ser: 0.76 mg/dL (ref 0.44–1.00)
GFR calc Af Amer: >60 mL/min (ref >60)
GFR calc non Af Amer: >60 mL/min (ref >60)
GLUCOSE: 91 mg/dL (ref 65–99)
POTASSIUM: 3.3 mmol/L — AB (ref 3.5–5.1)
SODIUM: 140 mmol/L (ref 135–145)
TOTAL PROTEIN: 5.8 g/dL — AB (ref 6.5–8.1)

## 2017-06-10 MED ORDER — POTASSIUM CHLORIDE CRYS ER 20 MEQ PO TBCR
30.0000 meq | EXTENDED_RELEASE_TABLET | Freq: Once | ORAL | Status: AC
  Administered 2017-06-10: 30 meq via ORAL
  Filled 2017-06-10: qty 1

## 2017-06-10 MED ORDER — ASPIRIN EC 81 MG PO TBEC
81.0000 mg | DELAYED_RELEASE_TABLET | Freq: Every day | ORAL | Status: DC
  Administered 2017-06-10 – 2017-06-12 (×3): 81 mg via ORAL
  Filled 2017-06-10 (×3): qty 1

## 2017-06-10 NOTE — Progress Notes (Addendum)
[facility] PROGRESS NOTE  Emily Hardin DGU:[id number] DOB: 10-16-1916 DOA: 06/09/2017 PCP: Patient, No Pcp Per  Brief summary  84 y.o. female with medical history significant of hypertension, depression who was sent to from her home for the evaluation of [facility]  for the evaluation of confusion.  Patient is a 84 year old but generally she is pretty sharp.  She walks with the help of walker .She has history of hypertension and takes medications at home and follows with her PCP.  History was obtained from patient and her granddaughter.  Patient lives with her granddaughter.  As per the granddaughter, patient has been increasingly confused since last couple of days.  She was found to be hallucinating.  She was hearing or things and talking to unseen people.  Patient was then taken to [facility] where she was found to have urinary tract infection as per the urinalysis.  Patient was then sent to [facility] for direct admission. There was no report of fever or chills.  Patient denies any abdominal pain or dysuria.  She does not complain of any chest pain, shortness of breath, palpitation, nausea, vomiting, headache. Patient seen and examined the bedside. She is a very pleasant elderly female and looks very comfortable.  She is alert and oriented.  Her mental status has improved and is close to her baseline.  She denies any problems at present.    ED Course: UA was suggestive of UTI.  She was given a dose of ceftriaxone.  Patient was also found to be hypertensive.     Assessment/Plan:  Possible Urinary tract infection: started on ceftriaxone. Pend cultures. Monitor    Confusion/hallucinations. Thought due to UTI.  reported diplopia, off balance at home. Currently mental status is close to baseline.  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 Will obtain mri brain for better evaluation. If positive needs full stroke work up, start aspirin.     Hypertension:Noted  to be hypertensive on presentation. resumed her home medications. Monitor, prn Hydralazine .  History of depression: On Celexa and trazodone.We will continue.   Code Status: DNR Family Communication: d/w patient, her family. RN (indicate person spoken with, relationship, and if by phone, the number) Disposition Plan: pend MRI brain, obtain pt/ot    Consultants:  none  Procedures:  none  Antibiotics: Anti-infectives (From admission, onward)   Start     Dose/Rate Route Frequency Ordered Stop   06/10/17 1200  cefTRIAXone (ROCEPHIN) 1 g in sodium chloride 0.9 % 100 mL IVPB     1 g 200 mL/hr over 30 Minutes Intravenous Every 24 hours 06/09/17 1718     06/09/17 1200  cefTRIAXone (ROCEPHIN) 1 g in sodium chloride 0.9 % 100 mL IVPB     1 g 200 mL/hr over 30 Minutes Intravenous  Once 06/09/17 1159 06/09/17 1251        (indicate start date, and stop date if known)  HPI/Subjective: Alert. Reports having intermittent diplopia at home, being off balance. Exam is non focal at this time. Confusion, hallucinations at home.  D/w her grandchild who said that those changes are acute.  Will obtain mri r/o stroke   Objective: Vitals:   06/09/17 2033 06/10/17 0403  BP: (!) 163/61 (!) 157/47  Pulse: 64 72  Resp: 18 18  Temp: 97.6 F (36.4 C) 97.8 F (36.6 C)  SpO2: 94% 92%    Intake/Output Summary (Last 24 hours) at 06/10/2017 0829 Last data filed at 06/10/2017 0643 Gross per 24  hour  Intake 2160.41 ml  Output -  Net 2160.41 ml   Filed Weights   06/09/17 1013  Weight: 72.6 kg (160 lb)    Exam:   General:  Alert. No distress   Cardiovascular: s1,s2 rrr  Respiratory: few crackles BL LL  Abdomen: soft, nt  Musculoskeletal: no leg edema    Data Reviewed: Basic Metabolic Panel: Recent Labs  Lab 06/09/17 1041 06/09/17 1901 06/10/17 0651  NA 136  --  140  K 4.0  --  3.3*  CL 100*  --  106  CO2 27  --  27  GLUCOSE 149*  --  91  BUN 13  --  8  CREATININE 0.91 0.76  0.76  CALCIUM 9.2  --  8.5*   Liver Function Tests: Recent Labs  Lab 06/09/17 1041 06/10/17 0651  AST 29 17  ALT 12* 8*  ALKPHOS 50 40  BILITOT 0.6 0.3  PROT 7.8 5.8*  ALBUMIN 4.1 3.0*   No results for input(s): LIPASE, AMYLASE in the last 168 hours. No results for input(s): AMMONIA in the last 168 hours. CBC: Recent Labs  Lab 06/09/17 1041 06/09/17 1901  WBC 8.8 6.9  NEUTROABS 5.8  --   HGB 9.0* 8.4*  HCT 30.0* 27.8*  MCV 85.2 84.8  PLT 258 216   Cardiac Enzymes: Recent Labs  Lab 06/09/17 1042  TROPONINI <0.03   BNP (last 3 results) No results for input(s): BNP in the last 8760 hours.  ProBNP (last 3 results) No results for input(s): PROBNP in the last 8760 hours.  CBG: No results for input(s): GLUCAP in the last 168 hours.  No results found for this or any previous visit (from the past 240 hour(s)).   Studies: Dg Chest 2 View  Result Date: 06/09/2017 CLINICAL DATA:  Confusion. EXAM: CHEST - 2 VIEW COMPARISON:  None. FINDINGS: The heart is mildly enlarged. There is tortuosity and calcification of the thoracic aorta. The pulmonary hila appear normal. The lungs are clear. Small pleural effusions are possible. The bony thorax is intact. IMPRESSION: Mild cardiac enlargement and tortuous calcified thoracic aorta. Possible small pleural effusions but no infiltrates or edema. Electronically Signed   By: Marijo Sanes M.D.   On: 06/09/2017 11:14   Ct Head Wo Contrast  Result Date: 06/09/2017 CLINICAL DATA:  Confusion EXAM: CT HEAD WITHOUT CONTRAST TECHNIQUE: Contiguous axial images were obtained from the base of the skull through the vertex without intravenous contrast. COMPARISON:  None. FINDINGS: Brain: There is moderate diffuse atrophy. There is no intracranial mass, hemorrhage, extra-axial fluid collection, or midline shift. There is small vessel disease throughout the centra semiovale bilaterally. There is evidence of a prior small infarct in the head of the caudate  nucleus on the left. There is small vessel disease in each internal and external capsule. There is evidence of a prior small infarct in the periphery of the mid left cerebellum. No acute infarct is evident. Vascular: There is no appreciable hyperdense vessel. There is calcification in each carotid siphon and distal vertebral artery. Skull: The bony calvarium appears intact. Sinuses/Orbits: There is mucosal thickening in several ethmoid air cells. Other visualized paranasal sinuses are clear. Visualized orbits appear symmetric bilaterally. Other: There is slight opacification in inferior mastoid air cell on the left. Mastoids elsewhere clear. IMPRESSION: Atrophy with extensive supratentorial small vessel disease. 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 No acute infarct evident. No mass or hemorrhage. There  are foci of arterial vascular calcification. There is mild ethmoid sinus disease bilaterally as well as opacification in inferior posterior left mastoid air cell. Electronically Signed   By: Lowella Grip III M.D.   On: 06/09/2017 10:58    Scheduled Meds: . atenolol  25 mg Oral Daily   And  . atenolol  12.5 mg Oral QHS  . citalopram  20 mg Oral QPM  . heparin  5,000 Units Subcutaneous Q8H  . lisinopril  20 mg Oral QPM  . traZODone  100 mg Oral QHS   Continuous Infusions: . sodium chloride 100 mL/hr at 06/10/17 0117  . cefTRIAXone (ROCEPHIN)  IV      Principal Problem:   UTI (urinary tract infection) Active Problems:   HTN (hypertension)   Depression   Altered mental state    Time spent: >35 minutes     Kinnie Feil  [facility] Pager [phone]. If 7PM-7AM, please contact night-coverage at [email or web address], password [facility] 06/10/2017, 8:29 AM  LOS: 1 day    Addendum: hypokalemia. Replace as  Needed Kinnie Feil

## 2017-06-10 NOTE — Evaluation (Signed)
Physical Therapy Evaluation Patient Details Name: Emily Hardin MRN: [id number] DOB: Jul 03, 1916 Today's Date: 06/10/2017   History of Present Illness  84 yo female admitted with UTI, hallucinations. Hx of HTN, depression.   Clinical Impression  On eval, pt was Min guard assist for mobility. She walked ~40 feet with a RW. Pt unable to walk beyond 40 feet due to bil LE weakness/legs feeling as if they would give away. Pt presents with general weakness, decreased activity tolerance, and impaired gait and balance. Pt reports history of multiple falls. Recommend HHPT and 24 hour supervision/assist at this time. Will follow and progress activity as tolerated.     Follow Up Recommendations Home health PT;Supervision/Assistance - 24 hour    Equipment Recommendations  None recommended by PT    Recommendations for Other Services       Precautions / Restrictions Precautions Precautions: Fall Restrictions Weight Bearing Restrictions: No      Mobility  Bed Mobility Overal bed mobility: Needs Assistance Bed Mobility: Sit to Supine       Sit to supine: Supervision;HOB elevated   General bed mobility comments: Increased time.   Transfers Overall transfer level: Needs assistance Equipment used: Rolling walker (2 wheeled) Transfers: Sit to/from Stand Sit to Stand: Min guard         General transfer comment: close guard for safety. Increased time.   Ambulation/Gait Ambulation/Gait assistance: Min guard Ambulation Distance (Feet): 40 Feet Assistive device: Rolling walker (2 wheeled) Gait Pattern/deviations: Step-through pattern;Decreased step length - right;Decreased step length - left;Trunk flexed     General Gait Details: slow gait speed. Pt requested to sit after ~40 feet due to legs feeling weak. Used chair to transport pt back to room.   Stairs            Wheelchair Mobility    Modified Rankin (Stroke Patients Only)       Balance Overall balance  assessment: Needs assistance;History of Falls         Standing balance support: Bilateral upper extremity supported Standing balance-Leahy Scale: Poor                               Pertinent Vitals/Pain Pain Assessment: No/denies pain    Home Living Family/patient expects to be discharged to:: Private residence Living Arrangements: Other relatives(granddaughter) Available Help at Discharge: Family;Available PRN/intermittently Type of Home: House Home Access: Stairs to enter     Home Layout: One level Home Equipment: Environmental consultant - 4 wheels;Cane - single point      Prior Function Level of Independence: Independent               Hand Dominance        Extremity/Trunk Assessment   Upper Extremity Assessment Upper Extremity Assessment: Generalized weakness    Lower Extremity Assessment Lower Extremity Assessment: Generalized weakness    Cervical / Trunk Assessment Cervical / Trunk Assessment: Kyphotic  Communication   Communication: HOH  Cognition Arousal/Alertness: Awake/alert Behavior During Therapy: WFL for tasks assessed/performed Overall Cognitive Status: Within Functional Limits for tasks assessed                                        General Comments      Exercises     Assessment/Plan    PT Assessment Patient needs continued PT services  PT Problem List Decreased strength;Decreased  mobility;Decreased balance;Decreased activity tolerance       PT Treatment Interventions DME instruction;Gait training;Functional mobility training;Therapeutic activities;Balance training;Patient/family education;Therapeutic exercise    PT Goals (Current goals can be found in the Care Plan section)  Acute Rehab PT Goals Patient Stated Goal: none stated PT Goal Formulation: With patient Time For Goal Achievement: 06/24/17 Potential to Achieve Goals: Fair    Frequency Min 3X/week   Barriers to discharge        Co-evaluation                AM-PAC PT "6 Clicks" Daily Activity  Outcome Measure Difficulty turning over in bed (including adjusting bedclothes, sheets and blankets)?: A Little Difficulty moving from lying on back to sitting on the side of the bed? : A Lot Difficulty sitting down on and standing up from a chair with arms (e.g., wheelchair, bedside commode, etc,.)?: A Little Help needed moving to and from a bed to chair (including a wheelchair)?: A Little Help needed walking in hospital room?: A Little Help needed climbing 3-5 steps with a railing? : A Lot 6 Click Score: 16    End of Session Equipment Utilized During Treatment: Gait belt Activity Tolerance: Patient limited by fatigue Patient left: in bed;with call bell/phone within reach   PT Visit Diagnosis: Muscle weakness (generalized) (M62.81);Difficulty in walking, not elsewhere classified (R26.2);History of falling (Z91.81);Repeated falls (R29.6)    Time: 0950-1007 PT Time Calculation (min) (ACUTE ONLY): 17 min   Charges:   PT Evaluation $PT Eval Moderate Complexity: 1 Mod     PT G Codes:          Weston Anna, MPT Pager: [phone]

## 2017-06-11 ENCOUNTER — Inpatient Hospital Stay (HOSPITAL_COMMUNITY): Payer: Medicare Other

## 2017-06-11 DIAGNOSIS — J690 Pneumonitis due to inhalation of food and vomit: Secondary | ICD-10-CM

## 2017-06-11 DIAGNOSIS — N3000 Acute cystitis without hematuria: Secondary | ICD-10-CM

## 2017-06-11 DIAGNOSIS — R4182 Altered mental status, unspecified: Secondary | ICD-10-CM

## 2017-06-11 LAB — CBC
HEMATOCRIT: 29.1 % — AB (ref 36.0–46.0)
Hemoglobin: 8.6 g/dL — ABNORMAL LOW (ref 12.0–15.0)
MCH: 25.8 pg — ABNORMAL LOW (ref 26.0–34.0)
MCHC: 29.6 g/dL — AB (ref 30.0–36.0)
MCV: 87.4 fL (ref 78.0–100.0)
Platelets: 254 10*3/uL (ref 150–400)
RBC: 3.33 MIL/uL — ABNORMAL LOW (ref 3.87–5.11)
RDW: 15.4 % (ref 11.5–15.5)
WBC: 7.1 10*3/uL (ref 4.0–10.5)

## 2017-06-11 LAB — BASIC METABOLIC PANEL
Anion gap: 8 (ref 5–15)
BUN: 10 mg/dL (ref 6–20)
CALCIUM: 9.1 mg/dL (ref 8.9–10.3)
CO2: 27 mmol/L (ref 22–32)
Chloride: 103 mmol/L (ref 101–111)
Creatinine, Ser: 0.91 mg/dL (ref 0.44–1.00)
GFR calc Af Amer: 59 mL/min — ABNORMAL LOW (ref >60)
GFR calc non Af Amer: 51 mL/min — ABNORMAL LOW (ref >60)
GLUCOSE: 89 mg/dL (ref 65–99)
Potassium: 3.7 mmol/L (ref 3.5–5.1)
Sodium: 138 mmol/L (ref 135–145)

## 2017-06-11 LAB — URINE CULTURE: CULTURE: NO GROWTH

## 2017-06-11 MED ORDER — QUETIAPINE FUMARATE 25 MG PO TABS
50.0000 mg | ORAL_TABLET | Freq: Every day | ORAL | Status: DC
  Administered 2017-06-11: 50 mg via ORAL
  Filled 2017-06-11: qty 2

## 2017-06-11 MED ORDER — IPRATROPIUM-ALBUTEROL 0.5-2.5 (3) MG/3ML IN SOLN: 3.0000 mL | RESPIRATORY_TRACT | Status: DC | PRN

## 2017-06-11 MED ORDER — ENOXAPARIN SODIUM 40 MG/0.4ML ~~LOC~~ SOLN
40.0000 mg | SUBCUTANEOUS | Status: DC
  Administered 2017-06-12: 40 mg via SUBCUTANEOUS
  Filled 2017-06-11: qty 0.4

## 2017-06-11 MED ORDER — SODIUM CHLORIDE 0.9 % IV SOLN
3.0000 g | Freq: Four times a day (QID) | INTRAVENOUS | Status: DC
  Administered 2017-06-11 – 2017-06-12 (×3): 3 g via INTRAVENOUS
  Filled 2017-06-11 (×4): qty 3

## 2017-06-11 NOTE — Progress Notes (Signed)
Pharmacy Antibiotic Note  Emily Hardin is a 84 y.o. female admitted on 06/09/2017 with pneumonia and UTI.  Pharmacy has been consulted for Unasyn dosing.  Plan: Unasyn 3gm IV Q6h Monitor renal function and cx data   Height: 5\' 2"  (157.5 cm) Weight: 160 lb (72.6 kg) IBW/kg (Calculated) : 50.1  Temp (24hrs), Avg:97.9 F (36.6 C), Min:97.7 F (36.5 C), Max:98.1 F (36.7 C)  Recent Labs  Lab 06/09/17 1041 06/09/17 1901 06/10/17 0651 06/11/17 0522  WBC 8.8 6.9  --  7.1  CREATININE 0.91 0.76 0.76 0.91    Estimated Creatinine Clearance: 32.2 mL/min (by C-G formula based on SCr of 0.91 mg/dL).    No Known Allergies  Antimicrobials this admission: 3/26 Rocephin >> 3/27 3/27 Unasyn >>   Dose adjustments this admission:  Microbiology results: 3/26 UCx: NG-F   Thank you for allowing pharmacy to be a part of this patient's care.  Biagio Borg 06/11/2017 2:28 PM

## 2017-06-11 NOTE — Progress Notes (Signed)
PROGRESS NOTE    Emily Hardin  RCV:[id number] DOB: 04-13-1916 DOA: 06/09/2017 PCP: Patient, No Pcp Per   Brief Narrative: 84 year old female with history of hypertension, anxiety depression who presented with confusion.  On admission patient was found to have UTI and is started on ceftriaxone  Assessment & Plan:   #Confusion likely acute metabolic encephalopathy in the setting of UTI versus aspiration pneumonia: Patient has no focal neurological deficit.  MRI of the brain with chronic finding and brain atrophy.  PT OT evaluation.  Discussed with the family members at bedside.  #Shortness of breath: Patient was reported coughing and shortness of breath today.  Chest x-ray was obtained which was consistent with possible aspiration pneumonia.  Started on IV Unasyn, consulted for swallow evaluation.  Patient is not hypoxic.  Likely change to oral Augmentin on discharge.  #Possible UTI, exact site unknown.  On IV antibiotics.  Follow-up culture results.  #History of hypertension: On atenolol, lisinopril.  #Resume home medication including Seroquel, Celexa.  On trazodone at bedtime.  DVT prophylaxis: Lovenox subcutaneous Code Status: DNR Family Communication: Discussed with the multiple family members regarding daughter and granddaughter at bedside Disposition Plan: Likely discharge home with home care services in 1 to 2 days    Consultants:   None  Procedures: None Antimicrobials: Change from IV ceftriaxone to IV Unasyn on 3/27  Subjective: Seen and examined at bedside.  Patient reported difficulty sleeping last night because of cough and mild shortness of breath.  Denied nausea vomiting.  Family members are at bedside.  Objective: Vitals:   06/10/17 1731 06/10/17 2035 06/11/17 0518 06/11/17 1405  BP: (!) 153/50 (!) 109/34 133/83 (!) 132/43  Pulse:  60 67 (!) 57  Resp:  (!) 23 20 18   Temp:  98 F (36.7 C) 98.1 F (36.7 C) 97.7 F (36.5 C)  TempSrc:  Oral Oral Oral    SpO2:  92% 92% 91%  Weight:      Height:        Intake/Output Summary (Last 24 hours) at 06/11/2017 1423 Last data filed at 06/11/2017 1418 Gross per 24 hour  Intake 160 ml  Output 150 ml  Net 10 ml   Filed Weights   06/09/17 1013  Weight: 72.6 kg (160 lb)    Examination:  General exam: Appears calm and comfortable  Respiratory system: Bilateral basal crackles at mild wheeze.  Respiratory effort normal. Cardiovascular system: S1 & S2 heard, RRR.  No pedal edema. Gastrointestinal system: Abdomen is nondistended, soft and nontender. Normal bowel sounds heard. Central nervous system: Alert awake and following commands Extremities: Symmetric 5 x 5 power. Skin: No rashes, lesions or ulcers    Data Reviewed: I have personally reviewed following labs and imaging studies  CBC: Recent Labs  Lab 06/09/17 1041 06/09/17 1901 06/11/17 0522  WBC 8.8 6.9 7.1  NEUTROABS 5.8  --   --   HGB 9.0* 8.4* 8.6*  HCT 30.0* 27.8* 29.1*  MCV 85.2 84.8 87.4  PLT 258 216 102   Basic Metabolic Panel: Recent Labs  Lab 06/09/17 1041 06/09/17 1901 06/10/17 0651 06/11/17 0522  NA 136  --  140 138  K 4.0  --  3.3* 3.7  CL 100*  --  106 103  CO2 27  --  27 27  GLUCOSE 149*  --  91 89  BUN 13  --  8 10  CREATININE 0.91 0.76 0.76 0.91  CALCIUM 9.2  --  8.5* 9.1   GFR: Estimated Creatinine Clearance:  32.2 mL/min (by C-G formula based on SCr of 0.91 mg/dL). Liver Function Tests: Recent Labs  Lab 06/09/17 1041 06/10/17 0651  AST 29 17  ALT 12* 8*  ALKPHOS 50 40  BILITOT 0.6 0.3  PROT 7.8 5.8*  ALBUMIN 4.1 3.0*   No results for input(s): LIPASE, AMYLASE in the last 168 hours. No results for input(s): AMMONIA in the last 168 hours. Coagulation Profile: No results for input(s): INR, PROTIME in the last 168 hours. Cardiac Enzymes: Recent Labs  Lab 06/09/17 1042  TROPONINI <0.03   BNP (last 3 results) No results for input(s): PROBNP in the last 8760 hours. HbA1C: No results  for input(s): HGBA1C in the last 72 hours. CBG: No results for input(s): GLUCAP in the last 168 hours. Lipid Profile: No results for input(s): CHOL, HDL, LDLCALC, TRIG, CHOLHDL, LDLDIRECT in the last 72 hours. Thyroid Function Tests: No results for input(s): TSH, T4TOTAL, FREET4, T3FREE, THYROIDAB in the last 72 hours. Anemia Panel: No results for input(s): VITAMINB12, FOLATE, FERRITIN, TIBC, IRON, RETICCTPCT in the last 72 hours. Sepsis Labs: No results for input(s): PROCALCITON, LATICACIDVEN in the last 168 hours.  Recent Results (from the past 240 hour(s))  Culture, Urine     Status: None   Collection Time: 06/10/17 10:19 AM  Result Value Ref Range Status   Specimen Description   Final    URINE, CLEAN CATCH Performed at [facility], [place] [address], [place], [place] [address]    Special Requests   Final    NONE Performed at [facility], [place] [address], [place], [place] [address]    Culture   Final    NO GROWTH Performed at [facility], [place] [address] [place], [place] [address]    Report Status 06/11/2017 FINAL  Final         Radiology Studies: Mr Brain Wo Contrast  Result Date: 06/10/2017 CLINICAL DATA:  New onset of confusion and hallucinations. EXAM: MRI HEAD WITHOUT CONTRAST TECHNIQUE: Multiplanar, multiecho pulse sequences of the brain and surrounding structures were obtained without intravenous contrast. COMPARISON:  CT 06/09/2017 FINDINGS: Brain: Diffusion imaging does not show any acute or subacute infarction. There chronic small-vessel changes of the pons. There are old bilateral cerebellar infarctions. Cerebral hemispheres show generalized atrophy with moderate to marked chronic small-vessel ischemic changes affecting the deep and subcortical white matter. No cortical or large vessel territory infarction. No mass lesion, hemorrhage, hydrocephalus or extra-axial collection. Vascular: Major vessels at the base of the brain  show flow. Skull and upper cervical spine: Negative Sinuses/Orbits: Clear/normal Other: Apparent scalp defect of the central frontal region. IMPRESSION: Generalized brain atrophy. Chronic small-vessel ischemic changes throughout the brain. No acute or reversible finding. Electronically Signed   By: Nelson Chimes M.D.   On: 06/10/2017 11:49   Dg Chest Port 1 View  Result Date: 06/11/2017 CLINICAL DATA:  Shortness of breath today. EXAM: PORTABLE CHEST 1 VIEW COMPARISON:  Chest x-ray dated 06/09/2017. FINDINGS: New airspace opacity at the right lung base, with associated air bronchograms, pneumonia versus aspiration. Stable cardiomegaly. Aortic atherosclerosis. No acute or suspicious osseous finding. IMPRESSION: 1. New pneumonia versus aspiration at the right lung base. 2. Stable cardiomegaly. 3. Aortic atherosclerosis. Electronically Signed   By: Franki Cabot M.D.   On: 06/11/2017 11:29        Scheduled Meds: . aspirin EC  81 mg Oral Daily  . atenolol  25 mg Oral Daily   And  . atenolol  12.5 mg Oral QHS  . citalopram  20 mg Oral QPM  . heparin  5,000 Units Subcutaneous Q8H  . lisinopril  20 mg Oral QPM  . QUEtiapine  50 mg Oral QHS  . traZODone  100 mg Oral QHS   Continuous Infusions:   LOS: 2 days    Dron Tanna Furry, MD [facility] Pager [phone]  If 7PM-7AM, please contact night-coverage [email or web address] Password [facility] 06/11/2017, 2:23 PM

## 2017-06-11 NOTE — Evaluation (Signed)
Clinical/Bedside Swallow Evaluation Patient Details  Name: Emily Hardin MRN: [id number] Date of Birth: 12/24/1916  Today's Date: 06/11/2017 Time: SLP Start Time (ACUTE ONLY): 1509 SLP Stop Time (ACUTE ONLY): 1535 SLP Time Calculation (min) (ACUTE ONLY): 26 min  Past Medical History:  Past Medical History:  Diagnosis Date  . Cancer ([place])   . Hypertension    Past Surgical History:  Past Surgical History:  Procedure Laterality Date  . ABDOMINAL HYSTERECTOMY    . CESAREAN SECTION     HPI:  84 yo female adm to [facility] with confusion, hallucinations, off balance and diplolpia.  Pt found to have a UTI.  PMH + for HTN, depression.  Brain MRI showed atrophy and CXR concerning for possible pna - ? asp pna. Swallow eval ordered.    Assessment / Plan / Recommendation Clinical Impression  Patient presents with functional oropharyngeal swallow ability.  CN exam unremarkable and she easily passed 3 ounce water test. Pt did report sensation of "esophagus being full of water" but denies reflux or esophageal deficits.  Pt with clear voice during all intake and able to self feed with no increase in WOB.  Recommend continue diet as tolerated, no SLP follow up indicated.  SLP Visit Diagnosis: Dysphagia, unspecified (R13.10)    Aspiration Risk  No limitations    Diet Recommendation Regular;Thin liquid   Liquid Administration via: Cup;Straw Medication Administration: Whole meds with liquid Supervision: Patient able to self feed Compensations: Slow rate;Small sips/bites Postural Changes: Seated upright at 90 degrees;Remain upright for at least 30 minutes after po intake    Other  Recommendations Oral Care Recommendations: Oral care BID   Follow up Recommendations   none     Frequency and Duration   n/a         Prognosis    n/a    Swallow Study   General Date of Onset: 06/11/17 HPI: 84 yo female adm to [facility] with confusion, hallucinations, off balance and diplolpia.  Pt found to have a  UTI.  PMH + for HTN, depression.  Brain MRI showed atrophy and CXR concerning for possible pna - ? asp pna. Swallow eval ordered.  Type of Study: Bedside Swallow Evaluation Diet Prior to this Study: Regular;Thin liquids Temperature Spikes Noted: No Respiratory Status: Room air History of Recent Intubation: No Behavior/Cognition: Alert;Cooperative Oral Cavity Assessment: Within Functional Limits Oral Care Completed by SLP: No Oral Cavity - Dentition: Adequate natural dentition(!!!!!!) Vision: Functional for self-feeding Self-Feeding Abilities: Able to feed self Patient Positioning: Upright in bed Baseline Vocal Quality: Normal Volitional Cough: Strong Volitional Swallow: Able to elicit    Oral/Motor/Sensory Function Overall Oral Motor/Sensory Function: Within functional limits   Ice Chips Ice chips: Not tested   Thin Liquid Thin Liquid: Within functional limits Presentation: Cup;Straw    Nectar Thick Nectar Thick Liquid: Not tested   Honey Thick Honey Thick Liquid: Not tested   Puree Puree: Within functional limits Presentation: Self Fed   Solid   GO   Solid: Within functional limits Presentation: Self Fredirick Lathe 06/11/2017,4:12 PM  Luanna Salk, [facility] SLP [phone]

## 2017-06-11 NOTE — Progress Notes (Signed)
PT Cancellation Note  Patient Details Name: Emily Hardin MRN: [id number] DOB: 1916/10/30   Cancelled Treatment:    Reason Eval/Treat Not Completed: Patient at procedure or test/unavailable.  Will check back as schedule permits.   Galen Manila 06/11/2017, 12:19 PM

## 2017-06-12 DIAGNOSIS — I10 Essential (primary) hypertension: Secondary | ICD-10-CM

## 2017-06-12 MED ORDER — AMOXICILLIN-POT CLAVULANATE 500-125 MG PO TABS: 1.0000 | ORAL_TABLET | Freq: Two times a day (BID) | ORAL | 0 refills | Status: AC

## 2017-06-12 MED ORDER — AMOXICILLIN-POT CLAVULANATE 500-125 MG PO TABS
1.0000 | ORAL_TABLET | Freq: Two times a day (BID) | ORAL | Status: DC
  Administered 2017-06-12: 500 mg via ORAL
  Filled 2017-06-12: qty 1

## 2017-06-12 NOTE — Progress Notes (Signed)
Physical Therapy Treatment Patient Details Name: Emily Hardin MRN: [id number] DOB: 1917/02/07 Today's Date: 06/12/2017    History of Present Illness 84 yo female admitted with UTI, hallucinations. Hx of HTN, depression.     PT Comments    Pt continues to participate fairly well. Discussed d/c plan with daughter who was present in room on today. ST rehab at SNF will be needed at discharge. Recommendation has been updated. Will continue to follow during hospital stay.     Follow Up Recommendations  SNF     Equipment Recommendations  None recommended by PT    Recommendations for Other Services       Precautions / Restrictions Precautions Precautions: Fall Restrictions Weight Bearing Restrictions: No    Mobility  Bed Mobility Overal bed mobility: Needs Assistance Bed Mobility: Supine to Sit     Supine to sit: Min assist;HOB elevated     General bed mobility comments: Increased time. Assist for trunk and to scoot to EOB.   Transfers Overall transfer level: Needs assistance Equipment used: Rolling walker (2 wheeled) Transfers: Sit to/from Stand Sit to Stand: Min assist         General transfer comment: Small amount of assist to rise, stabilize. VCs safety, hand placement.   Ambulation/Gait Ambulation/Gait assistance: Min assist Ambulation Distance (Feet): 35 Feet Assistive device: Rolling walker (2 wheeled) Gait Pattern/deviations: Step-through pattern;Decreased step length - right;Decreased step length - left;Trunk flexed     General Gait Details: slow gait speed. Assist to stabilize pt throughout ambulation distance.    Stairs            Wheelchair Mobility    Modified Rankin (Stroke Patients Only)       Balance Overall balance assessment: Needs assistance;History of Falls           Standing balance-Leahy Scale: Poor                              Cognition Arousal/Alertness: Awake/alert Behavior During Therapy: WFL  for tasks assessed/performed Overall Cognitive Status: Within Functional Limits for tasks assessed                                        Exercises      General Comments        Pertinent Vitals/Pain Pain Assessment: No/denies pain    Home Living                      Prior Function            PT Goals (current goals can now be found in the care plan section) Progress towards PT goals: Progressing toward goals    Frequency    Min 3X/week      PT Plan Discharge plan needs to be updated    Co-evaluation              AM-PAC PT "6 Clicks" Daily Activity  Outcome Measure  Difficulty turning over in bed (including adjusting bedclothes, sheets and blankets)?: A Little Difficulty moving from lying on back to sitting on the side of the bed? : Unable Difficulty sitting down on and standing up from a chair with arms (e.g., wheelchair, bedside commode, etc,.)?: Unable Help needed moving to and from a bed to chair (including a wheelchair)?: A Little Help needed walking in hospital room?:  A Little Help needed climbing 3-5 steps with a railing? : A Lot 6 Click Score: 13    End of Session Equipment Utilized During Treatment: Gait belt Activity Tolerance: Patient tolerated treatment well Patient left: in chair;with call bell/phone within reach;with family/visitor present   PT Visit Diagnosis: Muscle weakness (generalized) (M62.81);Difficulty in walking, not elsewhere classified (R26.2);History of falling (Z91.81);Repeated falls (R29.6)     Time: 7841-2820 PT Time Calculation (min) (ACUTE ONLY): 16 min  Charges:  $Gait Training: 8-22 mins                    G Codes:          Weston Anna, MPT Pager: [phone]

## 2017-06-12 NOTE — Clinical Social Work Note (Signed)
Clinical Social Work Assessment  Patient Details  Name: Emily Hardin MRN: [id number] Date of Birth: Dec 29, 1916  Date of referral:  06/12/17               Reason for consult:  Facility Placement                Permission sought to share information with:  Case Manager, Customer service manager, Family Supports Permission granted to share information::  Yes, Verbal Permission Granted  Name::     Corporate investment banker::  SNF  Relationship::  Daughters  Contact Information:     Housing/Transportation Living arrangements for the past 2 months:  [place] of Information:  Patient, Adult Children Patient Interpreter Needed:  None Criminal Activity/Legal Involvement Pertinent to Current Situation/Hospitalization:  No - Comment as needed Significant Relationships:  Adult Children Lives with:  Adult Children Do you feel safe going back to the place where you live?  Yes Need for family participation in patient care:  Yes (Comment)  Care giving concerns:  Patient and family expressed concerns about patient coming home with home health/24 hour supervision. Patient is requiring 2+ assist and family is not able to provide that. Patient lives with her daughter who has medical issues.    Social Worker assessment / plan:  LCSW following for SNF placement.   Patient admitted for confusion.   LCSW met with patient at bedside. Family present. LCSW explained role and reason for visit.   Patient and family are agreeable to SNF at dc.   According to patient's daughter prior to hospitalization patient was able to ambulate with a cane and walker. She reports that patient was able to ambulate to the bathroom on her own. Patient needed assistance with bathing and dressing, but was able to feed independently. Daughter reports that patient was able to administer own meds, but that is becoming difficult due to worsening vision.   Current recommendation for [facility] PT/OT. Family is not able to  provide 24/hr supervision and 2+ assist.   PLAN: Patient will go to SNF at dc.   Employment status:  Retired Forensic scientist:  [employer] PT Recommendations:  [place], Home with [place] / Referral to community resources:  [place]  Patient/Family's Response to care:  Patient and family are thankful for LCSW visit.   Patient/Family's Understanding of and Emotional Response to Diagnosis, Current Treatment, and Prognosis:  Patient and family are understanding of current diagnosis. Patient and family are not agreeable to [facility]. Patient and family states that patient needs to get stronger before coming home.   Emotional Assessment Appearance:  Appears stated age Attitude/Demeanor/Rapport:    Affect (typically observed):  Calm, Accepting Orientation:  Oriented to Self, Oriented to Place, Oriented to Situation, Oriented to  Time Alcohol / Substance use:  Not Applicable Psych involvement (Current and /or in the community):  No (Comment)  Discharge Needs  Concerns to be addressed:  No discharge needs identified Readmission within the last 30 days:  No Current discharge risk:  None Barriers to Discharge:  No SNF bed   Servando Snare, LCSW 06/12/2017, 11:01 AM

## 2017-06-12 NOTE — Clinical Social Work Placement (Addendum)
    Patient and family chose bed at [facility].  LCSW confirmed bed with facility.   LCSW faxed dc docs to facility.  Patient will transport by [facility].   LCSW notified family of transport.   Patient needs DNR signed. LCSW notified attending.   RN report number: [phone]  BK[facility]  CLINICAL SOCIAL WORK PLACEMENT  NOTE  Date:  06/12/2017  Patient Details  Name: Emily Hardin MRN: [id number] Date of Birth: 06-12-1916  Clinical Social Work is seeking post-discharge placement for this patient at the [place] level of care (*CSW will initial, date and re-position this form in  chart as items are completed):  Yes   Patient/family provided with [place] Work Department's list of facilities offering this level of care within the geographic area requested by the patient (or if unable, by the patient's family).  Yes   Patient/family informed of their freedom to choose among providers that offer the needed level of care, that participate in Medicare, Medicaid or managed care program needed by the patient, have an available bed and are willing to accept the patient.  Yes   Patient/family informed of [facility]'s ownership interest in [facility] and [facility], as well as of the fact that they are under no obligation to receive care at these facilities.  PASRR submitted to EDS on       PASRR number received on 06/12/17     Existing PASRR number confirmed on       FL2 transmitted to all facilities in geographic area requested by pt/family on 06/12/17     FL2 transmitted to all facilities within larger geographic area on       Patient informed that his/her managed care company has contracts with or will negotiate with certain facilities, including the following:        Yes   Patient/family informed of bed offers received.  Patient chooses bed at [facility]     Physician recommends and patient chooses bed at      Patient to be  transferred to [facility] on 06/12/17.  Patient to be transferred to facility by EMS     Patient family notified on 06/12/17 of transfer.  Name of family member notified:  Gayle     PHYSICIAN Please sign DNR     Additional Comment:    _______________________________________________ Servando Snare, LCSW 06/12/2017, 2:43 PM

## 2017-06-12 NOTE — Discharge Summary (Addendum)
Physician Discharge Summary  Sherria [name] MWN:[id number] DOB: [birth year] DOA: 06/09/2017  PCP: Patient, No Pcp Per  Admit date: 06/09/2017 Discharge date: 06/12/2017  Admitted From:home Disposition: SNF  Recommendations for Outpatient Follow-up:  1. Follow up with PCP in 1-2 weeks 2. Recommended palliative care evaluation   Home Health: SNF Equipment/Devices:none Discharge Condition:stable CODE STATUS:DNR Diet recommendation:heart healthy  Brief/Interim Summary: 84 year old female with history of hypertension, anxiety depression presented with confusion.  Patient confusion likely acute metabolic encephalopathy in the setting of UTI versus aspiration pneumonia.  Urine culture negative.  Patient reported shortness of breath therefore chest x-ray was obtained which was consistent with aspiration pneumonia.  Treated with IV Unasyn and then switched to oral Augmentin on discharge.  MRI of the brain with chronic finding and brain atrophy.  PT OT evaluated the patient and recommended SNF.  I discussed with the Education officer, museum.  Patient will be discharged to SNF when bed is available.  Resume home medication on discharge.  Patient likely has a symptomatic bacteriuria.  I have discussed with the patient's daughter and granddaughter at bedside.  I recommended palliative care follow-up at SNF.  Discharge Diagnoses:  Principal Problem: Acute metabolic encephalopathy in the setting of aspiration pneumonia Active Problems:   HTN (hypertension)   Depression     Aspiration pneumonia unknown if it was present during admission Asymptomatic bacteriuria.   Discharge Instructions  Discharge Instructions    Call MD for:  difficulty breathing, headache or visual disturbances   Complete by:  As directed    Call MD for:  extreme fatigue   Complete by:  As directed    Call MD for:  hives   Complete by:  As directed    Call MD for:  persistant dizziness or light-headedness   Complete by:  As  directed    Call MD for:  persistant nausea and vomiting   Complete by:  As directed    Call MD for:  severe uncontrolled pain   Complete by:  As directed    Call MD for:  temperature >100.4   Complete by:  As directed    Diet - low sodium heart healthy   Complete by:  As directed    Increase activity slowly   Complete by:  As directed      Allergies as of 06/12/2017   No Known Allergies     Medication List    TAKE these medications   acetaminophen 325 MG tablet Commonly known as:  TYLENOL Take 650 mg by mouth at bedtime.   amoxicillin-clavulanate 500-125 MG tablet Commonly known as:  AUGMENTIN Take 1 tablet (500 mg total) by mouth 2 (two) times daily for 5 days. 5 days   atenolol 25 MG tablet Commonly known as:  TENORMIN Take 12.5-25 mg by mouth 2 (two) times daily. Take 0.5 tablet (12.5 mg) in the am and Take 1 tablet (25 mg) in the pm.   CAL-GEST ANTACID 500 MG chewable tablet Generic drug:  calcium carbonate Chew 1 tablet by mouth 3 (three) times daily as needed for indigestion or heartburn.   citalopram 20 MG tablet Commonly known as:  CELEXA Take 20 mg by mouth every evening.   lisinopril 20 MG tablet Commonly known as:  PRINIVIL,ZESTRIL Take 20 mg by mouth every evening.   multivitamin-prenatal 27-0.8 MG Tabs tablet Take 1 tablet by mouth daily at 12 noon.   QUEtiapine 50 MG tablet Commonly known as:  SEROQUEL Take 50 mg by mouth at bedtime.  traZODone 100 MG tablet Commonly known as:  DESYREL Take 100 mg by mouth 2 (two) times daily.   vitamin A 8000 UNIT capsule Take 8,000 Units by mouth daily.       No Known Allergies  Consultations: None  Procedures/Studies: None   Subjective: seen and examined at bedside.  Reported he slept well and denies shortness of breath or cough.  No nausea vomiting.  Review of systems limited.  Mental status around baseline as per daughter.  Discharge Exam: Vitals:   06/11/17 2029 06/12/17 0533  BP: (!)  160/73 (!) 162/61  Pulse: 68 66  Resp: 18 18  Temp: 98.5 F (36.9 C) 98.5 F (36.9 C)  SpO2: 93% 92%   Vitals:   06/11/17 0518 06/11/17 1405 06/11/17 2029 06/12/17 0533  BP: 133/83 (!) 132/43 (!) 160/73 (!) 162/61  Pulse: 67 (!) 57 68 66  Resp: 20 18 18 18   Temp: 98.1 F (36.7 C) 97.7 F (36.5 C) 98.5 F (36.9 C) 98.5 F (36.9 C)  TempSrc: Oral Oral Oral Oral  SpO2: 92% 91% 93% 92%  Weight:      Height:        General: Pt is alert, awake, not in acute distress Cardiovascular: RRR, S1/S2 +, no rubs, no gallops Respiratory: CTA bilaterally, no wheezing, no rhonchi Abdominal: Soft, NT, ND, bowel sounds + Extremities: no edema, no cyanosis Pleasant elderly female oriented to herself and hospital.   The results of significant diagnostics from this hospitalization (including imaging, microbiology, ancillary and laboratory) are listed below for reference.     Microbiology: Recent Results (from the past 240 hour(s))  Culture, Urine     Status: None   Collection Time: 06/10/17 10:19 AM  Result Value Ref Range Status   Specimen Description   Final    URINE, CLEAN CATCH Performed at [facility], [place] [address], [place], [place] [address]    Special Requests   Final    NONE Performed at [facility], [place] [address] [place], [place] [address]    Culture   Final    NO GROWTH Performed at [facility], [place] [address] [place], [place] [address]    Report Status 06/11/2017 FINAL  Final     Labs: BNP (last 3 results) No results for input(s): BNP in the last 8760 hours. Basic Metabolic Panel: Recent Labs  Lab 06/09/17 1041 06/09/17 1901 06/10/17 0651 06/11/17 0522  NA 136  --  140 138  K 4.0  --  3.3* 3.7  CL 100*  --  106 103  CO2 27  --  27 27  GLUCOSE 149*  --  91 89  BUN 13  --  8 10  CREATININE 0.91 0.76 0.76 0.91  CALCIUM 9.2  --  8.5* 9.1   Liver Function Tests: Recent Labs  Lab 06/09/17 1041  06/10/17 0651  AST 29 17  ALT 12* 8*  ALKPHOS 50 40  BILITOT 0.6 0.3  PROT 7.8 5.8*  ALBUMIN 4.1 3.0*   No results for input(s): LIPASE, AMYLASE in the last 168 hours. No results for input(s): AMMONIA in the last 168 hours. CBC: Recent Labs  Lab 06/09/17 1041 06/09/17 1901 06/11/17 0522  WBC 8.8 6.9 7.1  NEUTROABS 5.8  --   --   HGB 9.0* 8.4* 8.6*  HCT 30.0* 27.8* 29.1*  MCV 85.2 84.8 87.4  PLT 258 216 254   Cardiac Enzymes: Recent Labs  Lab 06/09/17 1042  TROPONINI <  0.03   BNP: Invalid input(s): POCBNP CBG: No results for input(s): GLUCAP in the last 168 hours. D-Dimer No results for input(s): DDIMER in the last 72 hours. Hgb A1c No results for input(s): HGBA1C in the last 72 hours. Lipid Profile No results for input(s): CHOL, HDL, LDLCALC, TRIG, CHOLHDL, LDLDIRECT in the last 72 hours. Thyroid function studies No results for input(s): TSH, T4TOTAL, T3FREE, THYROIDAB in the last 72 hours.  Invalid input(s): FREET3 Anemia work up No results for input(s): VITAMINB12, FOLATE, FERRITIN, TIBC, IRON, RETICCTPCT in the last 72 hours. Urinalysis    Component Value Date/Time   COLORURINE YELLOW 06/09/2017 1101   APPEARANCEUR CLEAR 06/09/2017 1101   LABSPEC 1.010 06/09/2017 1101   PHURINE 7.0 06/09/2017 1101   GLUCOSEU NEGATIVE 06/09/2017 1101   HGBUR NEGATIVE 06/09/2017 1101   BILIRUBINUR NEGATIVE 06/09/2017 1101   KETONESUR NEGATIVE 06/09/2017 1101   PROTEINUR 30 (A) 06/09/2017 1101   NITRITE NEGATIVE 06/09/2017 1101   LEUKOCYTESUR MODERATE (A) 06/09/2017 1101   Sepsis Labs Invalid input(s): PROCALCITONIN,  WBC,  LACTICIDVEN Microbiology Recent Results (from the past 240 hour(s))  Culture, Urine     Status: None   Collection Time: 06/10/17 10:19 AM  Result Value Ref Range Status   Specimen Description   Final    URINE, CLEAN CATCH Performed at [facility], [place] [address] [place], [place] [address]    Special Requests   Final     NONE Performed at [facility], [place] [address], [place], [place] [address]    Culture   Final    NO GROWTH Performed at [facility], [place] [address], [place], [place] [address]    Report Status 06/11/2017 FINAL  Final     Time coordinating discharge: 27 minutes  SIGNED:   Rosita Fire, MD  [facility] 06/12/2017, 2:21 PM  If 7PM-7AM, please contact night-coverage [email or web address] Password [email or web address]

## 2017-06-12 NOTE — Progress Notes (Signed)
Report called to Ivin Booty, Therapist, sports at [facility].  Patient transported via EMS with all her belongings.

## 2017-06-12 NOTE — [place] FL2 (Signed)
[place] MEDICAID FL2 LEVEL OF CARE SCREENING TOOL     IDENTIFICATION  Patient Name: Emily Hardin Birthdate: Apr 05, 1916 Sex: female Admission Date (Current Location): 06/09/2017  [facility] and [place] Number:  Herbalist and Address:  [facility],  [place] [address] [place]      Provider Number: [id number]  Attending Physician Name and Address:  Rosita Fire, MD  Relative Name and Phone Number:       Current Level of Care: Hospital Recommended Level of Care: [place] Prior Approval Number:    Date Approved/Denied:   PASRR Number:   [id number]   Discharge Plan: SNF    Current Diagnoses: Patient Active Problem List   Diagnosis Date Noted  . Aspiration pneumonia due to gastric secretions ([place])   . UTI (urinary tract infection) 06/09/2017  . HTN (hypertension) 06/09/2017  . Depression 06/09/2017  . Altered mental state 06/09/2017    Orientation RESPIRATION BLADDER Height & Weight     Self, Time, Situation, Place  Normal Continent Weight: 160 lb (72.6 kg) Height:  5\' 2"  (157.5 cm)  BEHAVIORAL SYMPTOMS/MOOD NEUROLOGICAL BOWEL NUTRITION STATUS      Continent Diet  AMBULATORY STATUS COMMUNICATION OF NEEDS Skin   Independent Verbally Normal                       Personal Care Assistance Level of Assistance  Bathing, Feeding, Dressing Bathing Assistance: Maximum assistance Feeding assistance: Independent Dressing Assistance: Maximum assistance     Functional Limitations Info  Sight, Hearing, Speech Sight Info: Impaired Hearing Info: Impaired Speech Info: Adequate    SPECIAL CARE FACTORS FREQUENCY  PT (By licensed PT), OT (By licensed OT)     PT Frequency: 5x/week OT Frequency: 5x/week            Contractures Contractures Info: Not present    Additional Factors Info  Code Status, Allergies Code Status Info: DNR Allergies Info: NKA           Current Medications (06/12/2017):   This is the current hospital active medication list Current Facility-Administered Medications  Medication Dose Route Frequency Provider Last Rate Last Dose  . amoxicillin-clavulanate (AUGMENTIN) 500-125 MG per tablet 500 mg  1 tablet Oral BID Rosita Fire, MD   500 mg at 06/12/17 1043  . aspirin EC tablet 81 mg  81 mg Oral Daily Kinnie Feil, MD   81 mg at 06/12/17 1043  . atenolol (TENORMIN) tablet 25 mg  25 mg Oral Daily Adhikari, Amrit, MD   25 mg at 06/12/17 1043   And  . atenolol (TENORMIN) tablet 12.5 mg  12.5 mg Oral QHS Shelly Coss, MD   12.5 mg at 06/11/17 2107  . citalopram (CELEXA) tablet 20 mg  20 mg Oral QPM Shelly Coss, MD   20 mg at 06/11/17 1849  . enoxaparin (LOVENOX) injection 40 mg  40 mg Subcutaneous Q24H Rosita Fire, MD   40 mg at 06/12/17 0044  . hydrALAZINE (APRESOLINE) injection 5 mg  5 mg Intravenous Q6H PRN Adhikari, Amrit, MD      . ipratropium-albuterol (DUONEB) 0.5-2.5 (3) MG/3ML nebulizer solution 3 mL  3 mL Nebulization Q4H PRN Rosita Fire, MD      . lisinopril (PRINIVIL,ZESTRIL) tablet 20 mg  20 mg Oral QPM Shelly Coss, MD   20 mg at 06/10/17 1737  . QUEtiapine (SEROQUEL) tablet 50 mg  50 mg Oral QHS Rosita Fire, MD  50 mg at 06/11/17 2106  . traZODone (DESYREL) tablet 100 mg  100 mg Oral QHS Shelly Coss, MD   100 mg at 06/11/17 2106     Discharge Medications: Please see discharge summary for a list of discharge medications.  Relevant Imaging Results:  Relevant Lab Results:   Additional Information ssn: [id number]  Servando Snare, LCSW

## 2017-07-26 DIAGNOSIS — I10 Essential (primary) hypertension: Secondary | ICD-10-CM | POA: Insufficient documentation

## 2017-07-26 DIAGNOSIS — F339 Major depressive disorder, recurrent, unspecified: Secondary | ICD-10-CM | POA: Insufficient documentation

## 2017-07-26 DIAGNOSIS — F5101 Primary insomnia: Secondary | ICD-10-CM | POA: Insufficient documentation

## 2017-08-13 ENCOUNTER — Inpatient Hospital Stay (HOSPITAL_COMMUNITY)
Admission: EM | Admit: 2017-08-13 | Discharge: 2017-08-15 | DRG: 281 | Disposition: A | Discharge status: Home / Self Care | Payer: Medicare Other | Attending: Internal Medicine | Admitting: Internal Medicine

## 2017-08-13 ENCOUNTER — Other Ambulatory Visit: Payer: Self-pay

## 2017-08-13 ENCOUNTER — Emergency Department (HOSPITAL_COMMUNITY): Payer: Medicare Other

## 2017-08-13 ENCOUNTER — Encounter (HOSPITAL_COMMUNITY): Payer: Self-pay | Admitting: Emergency Medicine

## 2017-08-13 DIAGNOSIS — G4700 Insomnia, unspecified: Secondary | ICD-10-CM | POA: Diagnosis present

## 2017-08-13 DIAGNOSIS — F32A Depression, unspecified: Secondary | ICD-10-CM | POA: Diagnosis present

## 2017-08-13 DIAGNOSIS — R001 Bradycardia, unspecified: Secondary | ICD-10-CM | POA: Diagnosis present

## 2017-08-13 DIAGNOSIS — R079 Chest pain, unspecified: Secondary | ICD-10-CM | POA: Diagnosis not present

## 2017-08-13 DIAGNOSIS — M549 Dorsalgia, unspecified: Secondary | ICD-10-CM | POA: Diagnosis present

## 2017-08-13 DIAGNOSIS — E871 Hypo-osmolality and hyponatremia: Secondary | ICD-10-CM | POA: Diagnosis not present

## 2017-08-13 DIAGNOSIS — I214 Non-ST elevation (NSTEMI) myocardial infarction: Secondary | ICD-10-CM | POA: Diagnosis not present

## 2017-08-13 DIAGNOSIS — I440 Atrioventricular block, first degree: Secondary | ICD-10-CM | POA: Diagnosis present

## 2017-08-13 DIAGNOSIS — F329 Major depressive disorder, single episode, unspecified: Secondary | ICD-10-CM | POA: Diagnosis not present

## 2017-08-13 DIAGNOSIS — Z79899 Other long term (current) drug therapy: Secondary | ICD-10-CM

## 2017-08-13 DIAGNOSIS — R7989 Other specified abnormal findings of blood chemistry: Secondary | ICD-10-CM | POA: Diagnosis present

## 2017-08-13 DIAGNOSIS — T502X5A Adverse effect of carbonic-anhydrase inhibitors, benzothiadiazides and other diuretics, initial encounter: Secondary | ICD-10-CM | POA: Diagnosis present

## 2017-08-13 DIAGNOSIS — I080 Rheumatic disorders of both mitral and aortic valves: Secondary | ICD-10-CM | POA: Diagnosis present

## 2017-08-13 DIAGNOSIS — D649 Anemia, unspecified: Secondary | ICD-10-CM | POA: Diagnosis present

## 2017-08-13 DIAGNOSIS — R402413 Glasgow coma scale score 13-15, at hospital admission: Secondary | ICD-10-CM | POA: Diagnosis present

## 2017-08-13 DIAGNOSIS — I10 Essential (primary) hypertension: Secondary | ICD-10-CM | POA: Diagnosis not present

## 2017-08-13 DIAGNOSIS — Z87891 Personal history of nicotine dependence: Secondary | ICD-10-CM

## 2017-08-13 DIAGNOSIS — Z66 Do not resuscitate: Secondary | ICD-10-CM | POA: Diagnosis present

## 2017-08-13 DIAGNOSIS — M79602 Pain in left arm: Secondary | ICD-10-CM | POA: Diagnosis present

## 2017-08-13 HISTORY — DX: Encephalopathy, unspecified: G93.40

## 2017-08-13 HISTORY — DX: Pneumonitis due to inhalation of food and vomit: J69.0

## 2017-08-13 HISTORY — DX: Major depressive disorder, single episode, unspecified: F32.9

## 2017-08-13 HISTORY — DX: Unspecified hearing loss, unspecified ear: H91.90

## 2017-08-13 HISTORY — DX: Legal blindness, as defined in USA: H54.8

## 2017-08-13 HISTORY — DX: Chest pain, unspecified: R07.9

## 2017-08-13 HISTORY — DX: Depression, unspecified: F32.A

## 2017-08-13 LAB — CBC WITH DIFFERENTIAL/PLATELET
Basophils Absolute: 0.1 10*3/uL (ref 0.0–0.1)
Basophils Relative: 1 %
EOS PCT: 0 %
Eosinophils Absolute: 0 10*3/uL (ref 0.0–0.7)
HEMATOCRIT: 34.9 % — AB (ref 36.0–46.0)
Hemoglobin: 11 g/dL — ABNORMAL LOW (ref 12.0–15.0)
LYMPHS ABS: 0.9 10*3/uL (ref 0.7–4.0)
Lymphocytes Relative: 13 %
MCH: 27 pg (ref 26.0–34.0)
MCHC: 31.5 g/dL (ref 30.0–36.0)
MCV: 85.7 fL (ref 78.0–100.0)
MONO ABS: 0.2 10*3/uL (ref 0.1–1.0)
Monocytes Relative: 3 %
Neutro Abs: 5.7 10*3/uL (ref 1.7–7.7)
Neutrophils Relative %: 83 %
PLATELETS: 193 10*3/uL (ref 150–400)
RBC: 4.07 MIL/uL (ref 3.87–5.11)
RDW: 21.3 % — ABNORMAL HIGH (ref 11.5–15.5)
WBC: 6.9 10*3/uL (ref 4.0–10.5)

## 2017-08-13 LAB — URINALYSIS, ROUTINE W REFLEX MICROSCOPIC
BILIRUBIN URINE: NEGATIVE
GLUCOSE, UA: NEGATIVE mg/dL
HGB URINE DIPSTICK: NEGATIVE
Ketones, ur: 20 mg/dL — AB
Leukocytes, UA: NEGATIVE
Nitrite: NEGATIVE
PROTEIN: NEGATIVE mg/dL
SPECIFIC GRAVITY, URINE: 1.015 (ref 1.005–1.030)
pH: 6 (ref 5.0–8.0)

## 2017-08-13 LAB — BASIC METABOLIC PANEL
Anion gap: 14 (ref 5–15)
BUN: 22 mg/dL — AB (ref 6–20)
CO2: 22 mmol/L (ref 22–32)
CREATININE: 0.88 mg/dL (ref 0.44–1.00)
Calcium: 9.8 mg/dL (ref 8.9–10.3)
Chloride: 97 mmol/L — ABNORMAL LOW (ref 101–111)
GFR calc Af Amer: >60 mL/min (ref >60)
GFR, EST NON AFRICAN AMERICAN: 53 mL/min — AB (ref >60)
GLUCOSE: 111 mg/dL — AB (ref 65–99)
Potassium: 4.3 mmol/L (ref 3.5–5.1)
SODIUM: 133 mmol/L — AB (ref 135–145)

## 2017-08-13 LAB — TROPONIN I: Troponin I: 0.04 ng/mL (ref <0.03)

## 2017-08-13 MED ORDER — SENNOSIDES-DOCUSATE SODIUM 8.6-50 MG PO TABS
2.0000 | ORAL_TABLET | Freq: Every day | ORAL | Status: DC
  Administered 2017-08-14: 2 via ORAL
  Filled 2017-08-13 (×2): qty 2

## 2017-08-13 MED ORDER — TAB-A-VITE/IRON PO TABS
1.0000 | ORAL_TABLET | Freq: Every day | ORAL | Status: DC
  Administered 2017-08-14 – 2017-08-15 (×2): 1 via ORAL
  Filled 2017-08-13 (×3): qty 1

## 2017-08-13 MED ORDER — POLYETHYLENE GLYCOL 3350 17 G PO PACK
17.0000 g | PACK | Freq: Every day | ORAL | Status: DC
  Administered 2017-08-14 – 2017-08-15 (×2): 17 g via ORAL
  Filled 2017-08-13 (×2): qty 1

## 2017-08-13 MED ORDER — ONDANSETRON HCL 4 MG/2ML IJ SOLN: 4.0000 mg | Freq: Four times a day (QID) | INTRAMUSCULAR | Status: DC | PRN

## 2017-08-13 MED ORDER — MORPHINE SULFATE (PF) 4 MG/ML IV SOLN: 1.0000 mg | INTRAVENOUS | Status: DC | PRN

## 2017-08-13 MED ORDER — NITROGLYCERIN 0.4 MG SL SUBL: 0.4000 mg | SUBLINGUAL_TABLET | SUBLINGUAL | Status: DC | PRN

## 2017-08-13 MED ORDER — ZOLPIDEM TARTRATE 5 MG PO TABS
2.5000 mg | ORAL_TABLET | Freq: Every evening | ORAL | Status: DC | PRN
  Administered 2017-08-15: 2.5 mg via ORAL
  Filled 2017-08-13: qty 1

## 2017-08-13 MED ORDER — ATENOLOL 25 MG PO TABS
12.5000 mg | ORAL_TABLET | Freq: Every day | ORAL | Status: DC
  Administered 2017-08-13 – 2017-08-14 (×2): 12.5 mg via ORAL
  Filled 2017-08-13 (×2): qty 1

## 2017-08-13 MED ORDER — CALCIUM CARBONATE ANTACID 500 MG PO CHEW
1.0000 | CHEWABLE_TABLET | Freq: Every day | ORAL | Status: DC
Start: 2017-08-14 — End: 2017-08-15
  Administered 2017-08-14 – 2017-08-15 (×2): 200 mg via ORAL
  Filled 2017-08-13 (×2): qty 1

## 2017-08-13 MED ORDER — ASPIRIN EC 81 MG PO TBEC
81.0000 mg | DELAYED_RELEASE_TABLET | Freq: Every day | ORAL | Status: DC
  Administered 2017-08-14 – 2017-08-15 (×2): 81 mg via ORAL
  Filled 2017-08-13 (×2): qty 1

## 2017-08-13 MED ORDER — CITALOPRAM HYDROBROMIDE 20 MG PO TABS
20.0000 mg | ORAL_TABLET | Freq: Every day | ORAL | Status: DC
  Administered 2017-08-14 – 2017-08-15 (×2): 20 mg via ORAL
  Filled 2017-08-13 (×2): qty 1

## 2017-08-13 MED ORDER — ACETAMINOPHEN 325 MG PO TABS
650.0000 mg | ORAL_TABLET | ORAL | Status: DC | PRN
  Administered 2017-08-15: 650 mg via ORAL
  Filled 2017-08-13: qty 2

## 2017-08-13 MED ORDER — IPRATROPIUM-ALBUTEROL 0.5-2.5 (3) MG/3ML IN SOLN: 3.0000 mL | Freq: Four times a day (QID) | RESPIRATORY_TRACT | Status: DC | PRN

## 2017-08-13 MED ORDER — HYDRALAZINE HCL 10 MG PO TABS
20.0000 mg | ORAL_TABLET | Freq: Two times a day (BID) | ORAL | Status: DC
  Administered 2017-08-13 – 2017-08-15 (×4): 20 mg via ORAL
  Filled 2017-08-13 (×5): qty 2

## 2017-08-13 MED ORDER — LISINOPRIL 20 MG PO TABS
20.0000 mg | ORAL_TABLET | Freq: Every day | ORAL | Status: DC
  Administered 2017-08-14 – 2017-08-15 (×2): 20 mg via ORAL
  Filled 2017-08-13 (×2): qty 1

## 2017-08-13 MED ORDER — ENOXAPARIN SODIUM 30 MG/0.3ML ~~LOC~~ SOLN
30.0000 mg | SUBCUTANEOUS | Status: DC
  Administered 2017-08-13: 30 mg via SUBCUTANEOUS
  Filled 2017-08-13 (×2): qty 0.3

## 2017-08-13 NOTE — H&P (Addendum)
History and Physical    Emily Hardin ZOX:[id number] DOB: [birth year] DOA: 08/13/2017  PCP: Shawna Clamp, MD   Patient coming from: [place]   Chief Complaint: Chest pain   HPI: Emily Hardin is a 84 y.o. female with medical history significant for hypertension, depression, insomnia, and chronic normocytic anemia, now presenting to the emergency department for evaluation of chest pain.  The patient reports that she has been in her usual state of health and was having an uneventful day when she developed the acute onset of pain in the central and left chest with radiation to the left arm.  She was at rest when symptoms developed and is unable to identify any alleviating or exacerbating factors.  There was no associated shortness of breath, nausea, or diaphoresis.  She has never experienced this previously.  EMS was called and the patient was treated with aspirin and 1 nitroglycerin prior to arrival in the ED.  ED Course: Upon arrival to the ED, patient is found to be afebrile, saturating well on room air, and with vitals otherwise normal.  EKG features a sinus bradycardia with rate 54, first-degree AV nodal block, and nonspecific ST abnormality.  Chest x-ray is notable for stable cardiomegaly and right lower lobe atelectasis versus scarring.  Chemistry panel features a sodium of 133 and CBC is notable for an improved normocytic anemia with hemoglobin of 11.  Troponin is slightly elevated at 0.04.  Pain had eased off prior to admission and the patient will be observed on the telemetry unit for ongoing evaluation and management.  Review of Systems:  All other systems reviewed and apart from HPI, are negative.  Past Medical History:  Diagnosis Date  . Aspiration pneumonia ([place])   . Cancer ([place])   . Depression   . Encephalopathy   . Hypertension     Past Surgical History:  Procedure Laterality Date  . ABDOMINAL HYSTERECTOMY    . CESAREAN SECTION       reports  that she quit smoking about 30 years ago. She has never used smokeless tobacco. She reports that she drinks about 0.6 oz of alcohol per week. She reports that she does not use drugs.  No Known Allergies  Family History  Problem Relation Age of Onset  . Obesity Daughter      Prior to Admission medications   Medication Sig Start Date End Date Taking? Authorizing Provider  acetaminophen (TYLENOL) 325 MG tablet Take 650 mg by mouth See admin instructions. Take two tablets (650 MG) at bedtime, patient may also take two tablets every six hours as needed for pain or fever.   Yes [provider]  atenolol (TENORMIN) 25 MG tablet Take 12.5 mg by mouth at bedtime. Take 0.5 tablet (12.5 mg) in the am and Take 1 tablet (25 mg) in the pm.   Yes [provider]  calcium carbonate (CAL-GEST ANTACID) 500 MG chewable tablet Chew 1 tablet by mouth daily.    Yes [provider]  citalopram (CELEXA) 20 MG tablet Take 20 mg by mouth daily.    Yes [provider]  hydrALAZINE (APRESOLINE) 10 MG tablet Take 20 mg by mouth 2 (two) times daily.   Yes [provider]  ipratropium-albuterol (DUONEB) 0.5-2.5 (3) MG/3ML SOLN Take 3 mLs by nebulization every 6 (six) hours as needed (for shortness of breath or wheezing).   Yes [provider]  lisinopril-hydrochlorothiazide (PRINZIDE,ZESTORETIC) 20-12.5 MG tablet Take 1 tablet by mouth daily.   Yes [provider]  loratadine (CLARITIN) 10 MG tablet Take 10 mg by mouth at bedtime.   Yes [provider]  Melatonin 3 MG TABS Take 3 mg by mouth at bedtime.   Yes [provider]  Menthol, Topical Analgesic, (BIOFREEZE) 4 % GEL Apply 1 application topically at bedtime. To knees   Yes [provider]  Multiple Vitamins-Iron (MULTIVITAMINS WITH IRON) TABS tablet Take 1 tablet by mouth daily.   Yes [provider]  polyethylene glycol (MIRALAX / GLYCOLAX) packet Take 17 g by mouth  daily.   Yes [provider]  Prenatal Vit-Fe Fumarate-FA (MULTIVITAMIN-PRENATAL) 27-0.8 MG TABS tablet Take 1 tablet by mouth daily.    Yes [provider]  senna-docusate (SENEXON-S) 8.6-50 MG tablet Take 2 tablets by mouth at bedtime.   Yes [provider]  vitamin A 8000 UNIT capsule Take 8,000 Units by mouth daily.   Yes [provider]  zolpidem (AMBIEN) 5 MG tablet Take 2.5 mg by mouth at bedtime as needed for sleep.   Yes [provider]    Physical Exam: Vitals:   08/13/17 1742 08/13/17 1855 08/13/17 2048 08/13/17 2200  BP: (!) 190/60 (!) 174/61 (!) 140/57   Pulse: 64 (!) 59 61   Resp: 14 16 16    Temp:      TempSrc:      SpO2: 100% 96% 95%   Weight:    64 kg (141 lb 1.5 oz)  Height:    5\' 2"  (1.575 m)      Constitutional: NAD, calm  Eyes: PERTLA, lids and conjunctivae normal ENMT: Mucous membranes are moist. Posterior pharynx clear of any exudate or lesions.   Neck: normal, supple, no masses, no thyromegaly Respiratory: clear to auscultation bilaterally, no wheezing, no crackles. Normal respiratory effort.    Cardiovascular: S1 & S2 heard, regular rate and rhythm. No extremity edema. No significant JVD. Abdomen: No distension, no tenderness, soft. Bowel sounds normal.  Musculoskeletal: no clubbing / cyanosis. No joint deformity upper and lower extremities.    Skin: no significant rashes, lesions, ulcers. Warm, dry, well-perfused. Neurologic: No facial asymmetry. Moving all extremities. Sensation to light touch intact. Gross hearing deficit.   Psychiatric: Alert and oriented to person, place, and situation. Pleasant and cooperative.     Labs on Admission: I have personally reviewed following labs and imaging studies  CBC: Recent Labs  Lab 08/13/17 1713  WBC 6.9  NEUTROABS 5.7  HGB 11.0*  HCT 34.9*  MCV 85.7  PLT 967   Basic Metabolic Panel: Recent Labs  Lab 08/13/17 1622  NA 133*  K 4.3  CL 97*  CO2 22    GLUCOSE 111*  BUN 22*  CREATININE 0.88  CALCIUM 9.8   GFR: Estimated Creatinine Clearance: 30.6 mL/min (by C-G formula based on SCr of 0.88 mg/dL). Liver Function Tests: No results for input(s): AST, ALT, ALKPHOS, BILITOT, PROT, ALBUMIN in the last 168 hours. No results for input(s): LIPASE, AMYLASE in the last 168 hours. No results for input(s): AMMONIA in the last 168 hours. Coagulation Profile: No results for input(s): INR, PROTIME in the last 168 hours. Cardiac Enzymes: Recent Labs  Lab 08/13/17 1622  TROPONINI 0.04*   BNP (last 3 results) No results for input(s): PROBNP in the last 8760 hours. HbA1C: No results for input(s): HGBA1C in the last 72 hours. CBG: No results for input(s): GLUCAP in the last 168 hours. Lipid Profile: No results for input(s): CHOL, HDL, LDLCALC, TRIG, CHOLHDL, LDLDIRECT in the last 72 hours.  Thyroid Function Tests: No results for input(s): TSH, T4TOTAL, FREET4, T3FREE, THYROIDAB in the last 72 hours. Anemia Panel: No results for input(s): VITAMINB12, FOLATE, FERRITIN, TIBC, IRON, RETICCTPCT in the last 72 hours. Urine analysis:    Component Value Date/Time   COLORURINE YELLOW 08/13/2017 1958   APPEARANCEUR CLEAR 08/13/2017 1958   LABSPEC 1.015 08/13/2017 1958   PHURINE 6.0 08/13/2017 1958   GLUCOSEU NEGATIVE 08/13/2017 1958   HGBUR NEGATIVE 08/13/2017 [place] NEGATIVE 08/13/2017 1958   KETONESUR 20 (A) 08/13/2017 1958   PROTEINUR NEGATIVE 08/13/2017 1958   NITRITE NEGATIVE 08/13/2017 1958   LEUKOCYTESUR NEGATIVE 08/13/2017 1958   Sepsis Labs: @LABRCNTIP (procalcitonin:4,lacticidven:4) )No results found for this or any previous visit (from the past 240 hour(s)).   Radiological Exams on Admission: Dg Chest 2 View  Result Date: 08/13/2017 CLINICAL DATA:  Centralized chest and LEFT arm pain beginning at noon today. Recent aspiration pneumonia. History of cancer. EXAM: CHEST - 2 VIEW COMPARISON:  Chest radiograph June 11, 2017 FINDINGS: Cardiac silhouette is moderately enlarged and unchanged. Calcified aortic knob. No pleural effusion or focal consolidation. Bandlike density RIGHT lung base. No pneumothorax. Scattered granulomata. LEFT apical calcified pleural plaque. Old RIGHT mid clavicle fracture. Old LEFT rib fractures. Old mild approximate L2 compression fracture. Osteopenia. Calcifications in the neck are likely vascular. IMPRESSION: 1. RIGHT lower lobe atelectasis/scarring. 2. Stable cardiomegaly. 3.  Aortic Atherosclerosis (ICD10-I70.0). Electronically Signed   By: Elon Alas M.D.   On: 08/13/2017 15:53    EKG: Independently reviewed. Sinus bradycardia (rate 54), 1st degree AV block, non-specific ST abnormality.   Assessment/Plan   1. Chest pain  - Presents after developing acute chest pain with radiation to left arm while at rest  - She was treated with ASA and NTG x1 by EMS and pain has eased off prior to admission  - CXR unremarkable, EKG without acute ischemic features appreciated, troponin slightly elevated at 0.04  - Continue cardiac monitoring, trend troponin, repeat EKG in am, continue beta-blocker and ACE, consider heparin infusion if pain returns or next troponin significantly elevated   2. Hypertension  - BP at goal  - Continue hydralazine, lisinopril, and atenolol, hold HCTZ in light of hyponatremia  3. Hyponatremia  - Serum sodium is 133  - Appears euvolemic, likely secondary to HCTZ will hold    4. Depression; insomnia  - Stable  - Continue Celexa and Ambien    DVT prophylaxis: Lovenox Code Status: DNR  Family Communication: Daughter updated at bedside Consults called: None Admission status: Observation     Vianne Bulls, MD [facility] Pager [phone]  If 7PM-7AM, please contact night-coverage [email or web address] Password [facility]  08/13/2017, 10:18 PM

## 2017-08-13 NOTE — ED Notes (Signed)
Admitting at bedside 

## 2017-08-13 NOTE — ED Provider Notes (Signed)
[place] EMERGENCY DEPARTMENT Provider Note   CSN: [id number] Arrival date & time: 08/13/17  1432     History   Chief Complaint Chief Complaint  Patient presents with  . Chest Pain    HPI Emily Hardin is a 84 y.o. female.  HPI Patient presents with chest pain.  Began around 1:00 today which was about 2 hours ago.  Has pain in her upper chest goes to the neck.  States it feels as if both of her arms going to explode.  Is also goes to the back.  Is some pain with swallowing.  States she just feels bad.  No nausea or vomiting.  No fevers.  No cough.  O no abdominal pain.  Has not had pains like this before.  No dysuria. Past Medical History:  Diagnosis Date  . Aspiration pneumonia ([place])   . Cancer ([place])   . Depression   . Encephalopathy   . Hypertension     Patient Active Problem List   Diagnosis Date Noted  . Aspiration pneumonia due to gastric secretions ([place])   . UTI (urinary tract infection) 06/09/2017  . HTN (hypertension) 06/09/2017  . Depression 06/09/2017  . Altered mental state 06/09/2017    Past Surgical History:  Procedure Laterality Date  . ABDOMINAL HYSTERECTOMY    . CESAREAN SECTION       OB History   None      Home Medications    Prior to Admission medications   Medication Sig Start Date End Date Taking? Authorizing Provider  acetaminophen (TYLENOL) 325 MG tablet Take 650 mg by mouth See admin instructions. Take two tablets (650 MG) at bedtime, patient may also take two tablets every six hours as needed for pain or fever.   Yes [provider]  atenolol (TENORMIN) 25 MG tablet Take 12.5 mg by mouth at bedtime. Take 0.5 tablet (12.5 mg) in the am and Take 1 tablet (25 mg) in the pm.   Yes [provider]  calcium carbonate (CAL-GEST ANTACID) 500 MG chewable tablet Chew 1 tablet by mouth daily.    Yes [provider]  citalopram (CELEXA) 20 MG tablet Take 20 mg by mouth daily.    Yes [provider]  hydrALAZINE (APRESOLINE) 10 MG tablet Take 20 mg by mouth 2 (two) times daily.   Yes [provider]  ipratropium-albuterol (DUONEB) 0.5-2.5 (3) MG/3ML SOLN Take 3 mLs by nebulization every 6 (six) hours as needed (for shortness of breath or wheezing).   Yes [provider]  lisinopril-hydrochlorothiazide (PRINZIDE,ZESTORETIC) 20-12.5 MG tablet Take 1 tablet by mouth daily.   Yes [provider]  loratadine (CLARITIN) 10 MG tablet Take 10 mg by mouth at bedtime.   Yes [provider]  Melatonin 3 MG TABS Take 3 mg by mouth at bedtime.   Yes [provider]  Menthol, Topical Analgesic, (BIOFREEZE) 4 % GEL Apply 1 application topically at bedtime. To knees   Yes [provider]  Multiple Vitamins-Iron (MULTIVITAMINS WITH IRON) TABS tablet Take 1 tablet by mouth daily.   Yes [provider]  polyethylene glycol (MIRALAX / GLYCOLAX) packet Take 17 g by mouth daily.   Yes [provider]  Prenatal Vit-Fe Fumarate-FA (MULTIVITAMIN-PRENATAL) 27-0.8 MG TABS tablet Take 1 tablet by mouth daily.    Yes [provider]  senna-docusate (SENEXON-S) 8.6-50 MG tablet Take 2 tablets by mouth at bedtime.   Yes [provider]  vitamin A 8000 UNIT capsule  Take 8,000 Units by mouth daily.   Yes [provider]  zolpidem (AMBIEN) 5 MG tablet Take 2.5 mg by mouth at bedtime as needed for sleep.   Yes [provider]    Family History No family history on file.  Social History Social History   Tobacco Use  . Smoking status: Former Smoker    Last attempt to quit: 06/10/1987    Years since quitting: 30.1  . Smokeless tobacco: Never Used  Substance Use Topics  . Alcohol use: Yes    Alcohol/week: 0.6 oz    Types: 1 Glasses of wine per week    Comment: 1 glass wine daily  . Drug use: Never     Allergies   Patient has no known allergies.   Review of Systems Review of Systems    Constitutional: Negative for appetite change.  HENT: Negative for congestion.   Respiratory: Negative for shortness of breath.   Cardiovascular: Positive for chest pain.  Gastrointestinal: Negative for abdominal pain.  Genitourinary: Negative for flank pain.  Musculoskeletal: Positive for neck pain.  Skin: Negative for rash.  Neurological: Negative for tremors.  Hematological: Negative for adenopathy.  Psychiatric/Behavioral: Negative for confusion.     Physical Exam Updated Vital Signs BP (!) 140/57   Pulse 61   Temp 98.1 F (36.7 C) (Oral)   Resp 16   SpO2 95%   Physical Exam  Constitutional: She appears well-developed.  HENT:  Head: Atraumatic.  Neck: Normal range of motion.  Cardiovascular: Normal rate, regular rhythm and normal pulses.  Pulmonary/Chest: Effort normal.  Musculoskeletal:       Right lower leg: She exhibits no tenderness.       Left lower leg: She exhibits no tenderness.  Neurological: She is alert.  Skin: Skin is warm.     ED Treatments / Results  Labs (all labs ordered are listed, but only abnormal results are displayed) Labs Reviewed  TROPONIN I - Abnormal; Notable for the following components:      Result Value   Troponin I 0.04 (*)    All other components within normal limits  BASIC METABOLIC PANEL - Abnormal; Notable for the following components:   Sodium 133 (*)    Chloride 97 (*)    Glucose, Bld 111 (*)    BUN 22 (*)    GFR calc non Af Amer 53 (*)    All other components within normal limits  URINALYSIS, ROUTINE W REFLEX MICROSCOPIC - Abnormal; Notable for the following components:   Ketones, ur 20 (*)    All other components within normal limits  CBC WITH DIFFERENTIAL/PLATELET - Abnormal; Notable for the following components:   Hemoglobin 11.0 (*)    HCT 34.9 (*)    RDW 21.3 (*)    All other components within normal limits  CBC WITH DIFFERENTIAL/PLATELET    EKG EKG Interpretation  Date/Time:  Wednesday Aug 13 2017  14:40:50 EDT Ventricular Rate:  54 PR Interval:  252 QRS Duration: 78 QT Interval:  432 QTC Calculation: 409 R Axis:   43 Text Interpretation:  Sinus bradycardia with 1st degree A-V block Nonspecific ST abnormality Abnormal ECG nonspecific inferior ST changes since 4 days ago Confirmed by Davonna Belling [phone]) on 08/13/2017 3:10:07 PM   Radiology Dg Chest 2 View  Result Date: 08/13/2017 CLINICAL DATA:  Centralized chest and LEFT arm pain beginning at noon today. Recent aspiration pneumonia. History of cancer. EXAM: CHEST - 2 VIEW COMPARISON:  Chest radiograph June 11, 2017 FINDINGS:  Cardiac silhouette is moderately enlarged and unchanged. Calcified aortic knob. No pleural effusion or focal consolidation. Bandlike density RIGHT lung base. No pneumothorax. Scattered granulomata. LEFT apical calcified pleural plaque. Old RIGHT mid clavicle fracture. Old LEFT rib fractures. Old mild approximate L2 compression fracture. Osteopenia. Calcifications in the neck are likely vascular. IMPRESSION: 1. RIGHT lower lobe atelectasis/scarring. 2. Stable cardiomegaly. 3.  Aortic Atherosclerosis (ICD10-I70.0). Electronically Signed   By: Elon Alas M.D.   On: 08/13/2017 15:53    Procedures Procedures (including critical care time)  Medications Ordered in ED Medications - No data to display   Initial Impression / Assessment and Plan / ED Course  I have reviewed the triage vital signs and the nursing notes.  Pertinent labs & imaging results that were available during my care of the patient were reviewed by me and considered in my medical decision making (see chart for details).     Patient with episode of chest pain.  Now almost completely resolved.  EKG reassuring but troponin is slightly elevated.  With slightly elevated troponin will admit to hospitalist for further evaluation treatment.  Is a limited code and is somewhat limited intervention and this needs to be considered after further  results come back.  Final Clinical Impressions(s) / ED Diagnoses   Final diagnoses:  Chest pain, unspecified type    ED Discharge Orders    None       Davonna Belling, MD 08/13/17 2056

## 2017-08-13 NOTE — ED Notes (Signed)
Pt was able to ambulate to restroom from hallway bed with walker. Gait was slow and stiff, but little to no balance problems were noted.

## 2017-08-13 NOTE — ED Triage Notes (Addendum)
Pt from [employer] assisted. She reports around lunch time she was sitting in room and had a sudden onset of chest pain to left side of chest that radiated into arm and back and into neck. EMS aspirin and 1 nitro. Facility gave her hydralazine before EMS got there. Denies SOB, N, Dizziness. DNR and MOST FORM at bedside

## 2017-08-14 ENCOUNTER — Other Ambulatory Visit: Payer: Self-pay

## 2017-08-14 ENCOUNTER — Encounter (HOSPITAL_COMMUNITY): Payer: Self-pay | Admitting: General Practice

## 2017-08-14 ENCOUNTER — Observation Stay (HOSPITAL_COMMUNITY): Payer: Medicare Other

## 2017-08-14 DIAGNOSIS — Z87891 Personal history of nicotine dependence: Secondary | ICD-10-CM | POA: Diagnosis not present

## 2017-08-14 DIAGNOSIS — R7989 Other specified abnormal findings of blood chemistry: Secondary | ICD-10-CM | POA: Diagnosis present

## 2017-08-14 DIAGNOSIS — Z66 Do not resuscitate: Secondary | ICD-10-CM | POA: Diagnosis present

## 2017-08-14 DIAGNOSIS — M549 Dorsalgia, unspecified: Secondary | ICD-10-CM | POA: Diagnosis present

## 2017-08-14 DIAGNOSIS — R001 Bradycardia, unspecified: Secondary | ICD-10-CM | POA: Diagnosis present

## 2017-08-14 DIAGNOSIS — I214 Non-ST elevation (NSTEMI) myocardial infarction: Secondary | ICD-10-CM | POA: Diagnosis present

## 2017-08-14 DIAGNOSIS — R079 Chest pain, unspecified: Secondary | ICD-10-CM | POA: Diagnosis present

## 2017-08-14 DIAGNOSIS — I080 Rheumatic disorders of both mitral and aortic valves: Secondary | ICD-10-CM | POA: Diagnosis present

## 2017-08-14 DIAGNOSIS — F329 Major depressive disorder, single episode, unspecified: Secondary | ICD-10-CM | POA: Diagnosis present

## 2017-08-14 DIAGNOSIS — I10 Essential (primary) hypertension: Secondary | ICD-10-CM

## 2017-08-14 DIAGNOSIS — R402413 Glasgow coma scale score 13-15, at hospital admission: Secondary | ICD-10-CM | POA: Diagnosis present

## 2017-08-14 DIAGNOSIS — Z79899 Other long term (current) drug therapy: Secondary | ICD-10-CM | POA: Diagnosis not present

## 2017-08-14 DIAGNOSIS — G4700 Insomnia, unspecified: Secondary | ICD-10-CM | POA: Diagnosis present

## 2017-08-14 DIAGNOSIS — E871 Hypo-osmolality and hyponatremia: Secondary | ICD-10-CM | POA: Diagnosis present

## 2017-08-14 DIAGNOSIS — I440 Atrioventricular block, first degree: Secondary | ICD-10-CM | POA: Diagnosis present

## 2017-08-14 DIAGNOSIS — M79602 Pain in left arm: Secondary | ICD-10-CM | POA: Diagnosis present

## 2017-08-14 DIAGNOSIS — D649 Anemia, unspecified: Secondary | ICD-10-CM

## 2017-08-14 DIAGNOSIS — T502X5A Adverse effect of carbonic-anhydrase inhibitors, benzothiadiazides and other diuretics, initial encounter: Secondary | ICD-10-CM | POA: Diagnosis present

## 2017-08-14 HISTORY — DX: Chest pain, unspecified: R07.9

## 2017-08-14 LAB — ECHOCARDIOGRAM COMPLETE
Height: 62 in
Weight: 2257.51 oz

## 2017-08-14 LAB — IRON AND TIBC
IRON: 126 ug/dL (ref 28–170)
SATURATION RATIOS: 42 % — AB (ref 10.4–31.8)
TIBC: 300 ug/dL (ref 250–450)
UIBC: 174 ug/dL

## 2017-08-14 LAB — MAGNESIUM: Magnesium: 1.7 mg/dL (ref 1.7–2.4)

## 2017-08-14 LAB — RETICULOCYTES
RBC.: 3.79 MIL/uL — ABNORMAL LOW (ref 3.87–5.11)
Retic Count, Absolute: 22.7 10*3/uL (ref 19.0–186.0)
Retic Ct Pct: 0.6 % (ref 0.4–3.1)

## 2017-08-14 LAB — TROPONIN I
TROPONIN I: 6.8 ng/mL — AB (ref <0.03)
TROPONIN I: 6.55 ng/mL — AB (ref <0.03)

## 2017-08-14 LAB — FOLATE: FOLATE: 61.1 ng/mL (ref >5.9)

## 2017-08-14 LAB — HEPARIN LEVEL (UNFRACTIONATED)
Heparin Unfractionated: 0.78 IU/mL — ABNORMAL HIGH (ref 0.30–0.70)
Heparin Unfractionated: 0.32 IU/mL (ref 0.30–0.70)

## 2017-08-14 LAB — VITAMIN B12: Vitamin B-12: 406 pg/mL (ref 180–914)

## 2017-08-14 LAB — FERRITIN: Ferritin: 26 ng/mL (ref 11–307)

## 2017-08-14 MED ORDER — HEPARIN BOLUS VIA INFUSION
2000.0000 [IU] | Freq: Once | INTRAVENOUS | Status: DC
  Filled 2017-08-14: qty 2000

## 2017-08-14 MED ORDER — HEPARIN (PORCINE) IN NACL 100-0.45 UNIT/ML-% IJ SOLN
700.0000 [IU]/h | INTRAMUSCULAR | Status: DC
  Administered 2017-08-14: 750 [IU]/h via INTRAVENOUS
  Administered 2017-08-15: 700 [IU]/h via INTRAVENOUS
  Filled 2017-08-14 (×2): qty 250

## 2017-08-14 MED ORDER — HEPARIN BOLUS VIA INFUSION
2000.0000 [IU] | Freq: Once | INTRAVENOUS | Status: AC
  Administered 2017-08-14: 2000 [IU] via INTRAVENOUS
  Filled 2017-08-14: qty 2000

## 2017-08-14 MED ORDER — ATORVASTATIN CALCIUM 40 MG PO TABS: 40.0000 mg | ORAL_TABLET | Freq: Every day | ORAL | Status: DC

## 2017-08-14 NOTE — ED Notes (Signed)
Secretary paged Dr., Myna Hidalgo ( admitting ) to notify on pt.'s elevated Troponin result this morning .

## 2017-08-14 NOTE — Progress Notes (Addendum)
PROGRESS NOTE    Jaye Saal  HKV:[id number] DOB: 1917/01/12 DOA: 08/13/2017 PCP: Shawna Clamp, MD   Brief Narrative:  HPI On 08/13/2017 by Dr. Darnelle Going Emily Hardin is a 84 y.o. female with medical history significant for hypertension, depression, insomnia, and chronic normocytic anemia, now presenting to the emergency department for evaluation of chest pain.  The patient reports that she has been in her usual state of health and was having an uneventful day when she developed the acute onset of pain in the central and left chest with radiation to the left arm.  She was at rest when symptoms developed and is unable to identify any alleviating or exacerbating factors.  There was no associated shortness of breath, nausea, or diaphoresis.  She has never experienced this previously.  EMS was called and the patient was treated with aspirin and 1 nitroglycerin prior to arrival in the ED.  Assessment & Plan   Chest pain/NSTEMI -Patient developed acute chest pain with radiation to the back and left arm at rest -Patient was given nitroglycerin and aspirin by EMS prior to admission -Chest X-ray unremarkable -EKG without any ischemic features appreciated- reviewed shows sinus bradycardia rate 54, first-degree AV block -Troponin elevated, 0.04, 3.45, 6.8, 6.55 -Patient started on heparin -Patient does not wish for any invasive procedures at this time, has opted for medical management -Discussed with patient the use of Plavix, she would like to think about it and discuss this with her daughter given that she is 30 years old and there is an increased risk of bleeding -Continue aspirin, atenolol, lisinopril, statin -Echocardiogram pending -Lipid panel pending -Discussed with cardiology via phone, Dr. Irish Lack, recommended use of Plavix per guidelines, however I relayed patient's apprehension. Recommended heparin for 48 hours. No further recommendations.   Essential  hypertension -Currently stable  -continue hydralazine, lisinopril, atenolol -HCTZ held due to hyponatremia  Mild hyponatremia -Sodium on admission 133 -Patient appears to be euvolemic -Continue to monitor BMP  Depression -Continue Celexa  Insomnia -Continue Ambien  Chronic Normocytic Anemia -Hemoglobin currently 11, upon review of patient's chart hemoglobin has been between 8 and 9 although the only laboratory values available are from March 2019 -Given that patient is on heparin, will continue to monitor CBC closely -will obtain anemia panel  DVT Prophylaxis  heparin  Code Status: DNR  Family Communication: None at bedside  Disposition Plan: Observation. Likely will be discharged back to ALF in 24-48 hours  Consultants Cardiology, via phone  Procedures  Echocardiogram  Antibiotics   Anti-infectives (From admission, onward)   None      Subjective:   Emily Hardin seen and examined today.  Patient no longer having chest pain.  Denies current shortness of breath, abdominal pain, nausea or vomiting, diarrhea or constipation, dizziness or headache.  Objective:   Vitals:   08/14/17 0900 08/14/17 0945 08/14/17 1000 08/14/17 1135  BP: 133/71 (!) 131/58 (!) 147/77 (!) 153/75  Pulse: (!) 59 (!) 55 (!) 56 (!) 54  Resp:      Temp:    97.9 F (36.6 C)  TempSrc:    Oral  SpO2: 96% 96% 96% 96%  Weight:    62.3 kg (137 lb 6.4 oz)  Height:    5\' 2"  (1.575 m)    Intake/Output Summary (Last 24 hours) at 08/14/2017 1206 Last data filed at 08/14/2017 1100 Gross per 24 hour  Intake 240 ml  Output -  Net 240 ml   Filed Weights   08/13/17 2200 08/14/17 1135  Weight: 64 kg (141 lb 1.5 oz) 62.3 kg (137 lb 6.4 oz)    Exam  General: Well developed, well nourished, elderly, no apparent distress, appears younger than stated age  3: NCAT, mucous membranes moist.   Neck: Supple  Cardiovascular: S1 S2 auscultated, no rubs, murmurs or gallops. Regular rate and  rhythm.  Respiratory: Clear to auscultation bilaterally with equal chest rise  Abdomen: Soft, nontender, nondistended, + bowel sounds  Extremities: warm dry without cyanosis clubbing or edema  Neuro: AAOx3, nonfocal  Psych: Normal affect and demeanor with intact judgement and insight   Data Reviewed: I have personally reviewed following labs and imaging studies  CBC: Recent Labs  Lab 08/13/17 1713  WBC 6.9  NEUTROABS 5.7  HGB 11.0*  HCT 34.9*  MCV 85.7  PLT 782   Basic Metabolic Panel: Recent Labs  Lab 08/13/17 1622  NA 133*  K 4.3  CL 97*  CO2 22  GLUCOSE 111*  BUN 22*  CREATININE 0.88  CALCIUM 9.8   GFR: Estimated Creatinine Clearance: 30.3 mL/min (by C-G formula based on SCr of 0.88 mg/dL). Liver Function Tests: No results for input(s): AST, ALT, ALKPHOS, BILITOT, PROT, ALBUMIN in the last 168 hours. No results for input(s): LIPASE, AMYLASE in the last 168 hours. No results for input(s): AMMONIA in the last 168 hours. Coagulation Profile: No results for input(s): INR, PROTIME in the last 168 hours. Cardiac Enzymes: Recent Labs  Lab 08/13/17 1622 08/13/17 2235 08/14/17 0355 08/14/17 1004  TROPONINI 0.04* 3.45* 6.80* 6.55*   BNP (last 3 results) No results for input(s): PROBNP in the last 8760 hours. HbA1C: No results for input(s): HGBA1C in the last 72 hours. CBG: No results for input(s): GLUCAP in the last 168 hours. Lipid Profile: No results for input(s): CHOL, HDL, LDLCALC, TRIG, CHOLHDL, LDLDIRECT in the last 72 hours. Thyroid Function Tests: No results for input(s): TSH, T4TOTAL, FREET4, T3FREE, THYROIDAB in the last 72 hours. Anemia Panel: No results for input(s): VITAMINB12, FOLATE, FERRITIN, TIBC, IRON, RETICCTPCT in the last 72 hours. Urine analysis:    Component Value Date/Time   COLORURINE YELLOW 08/13/2017 1958   APPEARANCEUR CLEAR 08/13/2017 1958   LABSPEC 1.015 08/13/2017 1958   PHURINE 6.0 08/13/2017 1958   GLUCOSEU NEGATIVE  08/13/2017 1958   HGBUR NEGATIVE 08/13/2017 [place] NEGATIVE 08/13/2017 1958   KETONESUR 20 (A) 08/13/2017 1958   PROTEINUR NEGATIVE 08/13/2017 1958   NITRITE NEGATIVE 08/13/2017 1958   LEUKOCYTESUR NEGATIVE 08/13/2017 1958   Sepsis Labs: @LABRCNTIP (procalcitonin:4,lacticidven:4)  )No results found for this or any previous visit (from the past 240 hour(s)).    Radiology Studies: Dg Chest 2 View  Result Date: 08/13/2017 CLINICAL DATA:  Centralized chest and LEFT arm pain beginning at noon today. Recent aspiration pneumonia. History of cancer. EXAM: CHEST - 2 VIEW COMPARISON:  Chest radiograph June 11, 2017 FINDINGS: Cardiac silhouette is moderately enlarged and unchanged. Calcified aortic knob. No pleural effusion or focal consolidation. Bandlike density RIGHT lung base. No pneumothorax. Scattered granulomata. LEFT apical calcified pleural plaque. Old RIGHT mid clavicle fracture. Old LEFT rib fractures. Old mild approximate L2 compression fracture. Osteopenia. Calcifications in the neck are likely vascular. IMPRESSION: 1. RIGHT lower lobe atelectasis/scarring. 2. Stable cardiomegaly. 3.  Aortic Atherosclerosis (ICD10-I70.0). Electronically Signed   By: Elon Alas M.D.   On: 08/13/2017 15:53     Scheduled Meds: . aspirin EC  81 mg Oral Daily  . atenolol  12.5 mg Oral QHS  . atorvastatin  40  mg Oral q1800  . calcium carbonate  1 tablet Oral Daily  . citalopram  20 mg Oral Daily  . hydrALAZINE  20 mg Oral BID  . lisinopril  20 mg Oral Daily  . multivitamins with iron  1 tablet Oral Daily  . polyethylene glycol  17 g Oral Daily  . senna-docusate  2 tablet Oral QHS   Continuous Infusions: . heparin 750 Units/hr (08/14/17 0059)     LOS: 0 days   Time Spent in minutes   45 minutes (greater than 50% of time spent with patient face to face, as well as reviewing old records, calling consults, and formulating a plan)  Nguyen Todorov D.O. on 08/14/2017 at 12:06  PM  Between 7am to 7pm - Pager - [phone]  After 7pm go to [email or web address] - password [email or web address]  And look for the night coverage person covering for me after hours  [facility] Office  [phone]

## 2017-08-14 NOTE — ED Notes (Addendum)
Pt to echo; report given to floor RN; nurse will call so we can get monitor when she arrives

## 2017-08-14 NOTE — ED Notes (Signed)
Secretary paged admitting MD to update on pt.'s elevated Troponin  result .  

## 2017-08-14 NOTE — ED Notes (Signed)
Family at bedside. 

## 2017-08-14 NOTE — Progress Notes (Signed)
  Echocardiogram 2D Echocardiogram has been performed.  Jennette Dubin 08/14/2017, 10:53 AM

## 2017-08-14 NOTE — ED Notes (Signed)
Patient is resting comfortably. 

## 2017-08-14 NOTE — ED Notes (Signed)
Dr. Myna Hidalgo ( admitting ) notified on pt.'s elevated Trponin result , pt. Is sleeping , no chest pain /respirations unlabored .

## 2017-08-14 NOTE — ED Notes (Signed)
Breakfast tray at bedside; no needs. MD rounded. Patient desiring no invasive treatment

## 2017-08-14 NOTE — ED Notes (Signed)
Dr. Baltazar Najjar notified on pt.'s elevated Troponin result .

## 2017-08-14 NOTE — Progress Notes (Signed)
ANTICOAGULATION CONSULT NOTE  Pharmacy Consult for heparin Indication: chest pain/ACS  No Known Allergies  Patient Measurements: Height: 5\' 2"  (157.5 cm) Weight: 137 lb 6.4 oz (62.3 kg) IBW/kg (Calculated) : 50.1 Heparin Dosing Weight: 63 kg  Vital Signs: Temp: 98.2 F (36.8 C) (05/30 2045) Temp Source: Oral (05/30 2045) BP: 108/63 (05/30 2045) Pulse Rate: 61 (05/30 2045)  Labs: Recent Labs    08/13/17 1622 08/13/17 1713 08/13/17 2235 08/14/17 0355 08/14/17 1004 08/14/17 2005  HGB  --  11.0*  --   --   --   --   HCT  --  34.9*  --   --   --   --   PLT  --  193  --   --   --   --   HEPARINUNFRC  --   --   --   --  0.78* 0.32  CREATININE 0.88  --   --   --   --   --   TROPONINI 0.04*  --  3.45* 6.80* 6.55*  --     Estimated Creatinine Clearance: 30.3 mL/min (by C-G formula based on SCr of 0.88 mg/dL).   Medical History: Past Medical History:  Diagnosis Date  . Aspiration pneumonia ([place])   . Cancer ([place])   . Chest pain 08/14/2017  . Depression   . Encephalopathy   . HOH (hard of hearing)   . Hypertension   . Legally blind     Assessment: 55 yoF admitted with chest pain. Pharmacy consulted to start heparin for ACS. Elevated troponin. No AC PTA.   Heparin level this evening came back therapeutic at 0.32, on 650 units/hr. No infusion issues. No s/sx of bleeding.   Goal of Therapy:  Heparin level 0.3-0.7 units/ml Monitor platelets by anticoagulation protocol: Yes   Plan:  Continue heparin infusion to 650 units/hr Monitor daily heparin level and CBC, s/sx bleeding F/u Cardiology plan for heparin  Doylene Canard, PharmD Clinical Pharmacist  Pager: [phone] Phone: [phone] 08/14/2017 9:15 PM

## 2017-08-14 NOTE — ED Notes (Signed)
Pt returned to unit- redirected to 6E. Monitor retrieved.

## 2017-08-14 NOTE — Progress Notes (Signed)
ANTICOAGULATION CONSULT NOTE - Initial Consult  Pharmacy Consult for heparin Indication: chest pain/ACS  No Known Allergies  Patient Measurements: Height: 5\' 2"  (157.5 cm) Weight: 141 lb 1.5 oz (64 kg) IBW/kg (Calculated) : 50.1 Heparin Dosing Weight: 63 kg  Vital Signs: Temp: 98.1 F (36.7 C) (05/29 1436) Temp Source: Oral (05/29 1436) BP: 146/63 (05/29 2259) Pulse Rate: 60 (05/29 2259)  Labs: Recent Labs    08/13/17 1622 08/13/17 1713 08/13/17 2235  HGB  --  11.0*  --   HCT  --  34.9*  --   PLT  --  193  --   CREATININE 0.88  --   --   TROPONINI 0.04*  --  3.45*    Estimated Creatinine Clearance: 30.6 mL/min (by C-G formula based on SCr of 0.88 mg/dL).   Medical History: Past Medical History:  Diagnosis Date  . Aspiration pneumonia ([place])   . Cancer ([place])   . Depression   . Encephalopathy   . Hypertension     Assessment: 39 yoF admitted with chest pain. Pharmacy consulted to start heparin for ACS. Elevated troponins 0.04>3.45. No AC PTA. Stable anemia. Patient did receive a dose of subQ Lovenox 30 mg @ 2328.  Goal of Therapy:  Heparin level 0.3-0.7 units/ml Monitor platelets by anticoagulation protocol: Yes   Plan:  Give 2000 units bolus x 1 Start heparin infusion at 750 units/hr Check anti-Xa level in 8 hours and daily while on heparin Continue to monitor H&H and platelets   Thank you for allowing [place] to participate in this patients care. Jens Som, PharmD

## 2017-08-14 NOTE — Progress Notes (Signed)
[facility] for heparin Indication: chest pain/ACS  No Known Allergies  Patient Measurements: Height: 5\' 2"  (157.5 cm) Weight: 141 lb 1.5 oz (64 kg) IBW/kg (Calculated) : 50.1 Heparin Dosing Weight: 63 kg  Vital Signs: BP: 147/77 (05/30 1000) Pulse Rate: 56 (05/30 1000)  Labs: Recent Labs    08/13/17 1622 08/13/17 1713 08/13/17 2235 08/14/17 0355 08/14/17 1004  HGB  --  11.0*  --   --   --   HCT  --  34.9*  --   --   --   PLT  --  193  --   --   --   HEPARINUNFRC  --   --   --   --  0.78*  CREATININE 0.88  --   --   --   --   TROPONINI 0.04*  --  3.45* 6.80* 6.55*    Estimated Creatinine Clearance: 30.6 mL/min (by C-G formula based on SCr of 0.88 mg/dL).   Medical History: Past Medical History:  Diagnosis Date  . Aspiration pneumonia ([place])   . Cancer ([place])   . Depression   . Encephalopathy   . Hypertension     Assessment: 14 yoF admitted with chest pain. Pharmacy consulted to start heparin for ACS. Elevated troponin. No AC PTA. Hg 11, plt wnl on admit - none today. Initial heparin level slightly high at 0.78. No bleed or issues with infusion per discussion with RN.  Goal of Therapy:  Heparin level 0.3-0.7 units/ml Monitor platelets by anticoagulation protocol: Yes   Plan:  Reduce heparin infusion to 650 units/hr 8h heparin level Monitor daily heparin level and CBC, s/sx bleeding F/u Cardiology plan for heparin  Elicia Lamp, PharmD, BCPS Clinical Pharmacist Clinical phone for 08/14/2017 until 3:30pm: [phone] If after 3:30pm, please call main pharmacy at: [phone] 08/14/2017 11:28 AM

## 2017-08-15 LAB — BASIC METABOLIC PANEL
ANION GAP: 6 (ref 5–15)
BUN: 14 mg/dL (ref 6–20)
CHLORIDE: 100 mmol/L — AB (ref 101–111)
CO2: 24 mmol/L (ref 22–32)
CREATININE: 0.86 mg/dL (ref 0.44–1.00)
Calcium: 8.6 mg/dL — ABNORMAL LOW (ref 8.9–10.3)
GFR calc non Af Amer: 54 mL/min — ABNORMAL LOW (ref >60)
Glucose, Bld: 107 mg/dL — ABNORMAL HIGH (ref 65–99)
Potassium: 3.8 mmol/L (ref 3.5–5.1)
Sodium: 130 mmol/L — ABNORMAL LOW (ref 135–145)

## 2017-08-15 LAB — CBC
HEMATOCRIT: 31.5 % — AB (ref 36.0–46.0)
HEMOGLOBIN: 10.1 g/dL — AB (ref 12.0–15.0)
MCH: 27.5 pg (ref 26.0–34.0)
MCHC: 32.1 g/dL (ref 30.0–36.0)
MCV: 85.8 fL (ref 78.0–100.0)
Platelets: 178 10*3/uL (ref 150–400)
RBC: 3.67 MIL/uL — ABNORMAL LOW (ref 3.87–5.11)
RDW: 21.4 % — ABNORMAL HIGH (ref 11.5–15.5)
WBC: 6.6 10*3/uL (ref 4.0–10.5)

## 2017-08-15 LAB — LIPID PANEL
Cholesterol: 159 mg/dL (ref 0–200)
HDL: 62 mg/dL (ref >40)
LDL CALC: 87 mg/dL (ref 0–99)
Total CHOL/HDL Ratio: 2.6 RATIO
Triglycerides: 50 mg/dL (ref <150)
VLDL: 10 mg/dL (ref 0–40)

## 2017-08-15 LAB — TROPONIN I: Troponin I: 3.45 ng/mL (ref <0.03)

## 2017-08-15 LAB — SODIUM: Sodium: 131 mmol/L — ABNORMAL LOW (ref 135–145)

## 2017-08-15 LAB — HEPARIN LEVEL (UNFRACTIONATED): HEPARIN UNFRACTIONATED: 0.27 [IU]/mL — AB (ref 0.30–0.70)

## 2017-08-15 MED ORDER — SODIUM CHLORIDE 0.9 % IV SOLN
INTRAVENOUS | Status: DC
  Administered 2017-08-15: 10:00:00 via INTRAVENOUS

## 2017-08-15 MED ORDER — LISINOPRIL 20 MG PO TABS: 20.0000 mg | ORAL_TABLET | Freq: Every day | ORAL | 0 refills | Status: AC

## 2017-08-15 MED ORDER — ASPIRIN 81 MG PO TBEC: 81.0000 mg | DELAYED_RELEASE_TABLET | Freq: Every day | ORAL | Status: DC

## 2017-08-15 NOTE — Clinical Social Work Note (Signed)
Clinical Social Work Assessment  Patient Details  Name: Emily Hardin MRN: [id number] Date of Birth: 07-26-1916  Date of referral:  08/15/17               Reason for consult:  Facility Placement(from [facility])                Permission sought to share information with:  Facility Sport and exercise psychologist, Family Supports Permission granted to share information::  Yes, Verbal Permission Granted  Name::     Ephraim Hamburger  Agency::  [facility]  Relationship::  daughter  Contact Information:  [phone]  Housing/Transportation Living arrangements for the past 2 months:  [place] of Information:  Patient, Facility Patient Interpreter Needed:  None Criminal Activity/Legal Involvement Pertinent to Current Situation/Hospitalization:  No - Comment as needed Significant Relationships:  Adult Children, Other Family Members Lives with:  Facility Resident Do you feel safe going back to the place where you live?  Yes Need for family participation in patient care:  Yes (Comment)  Care giving concerns:  Patient from [facility].   Social Worker assessment / plan: CSW met with patient at bedside. Patient sleeping but roused to voice. CSW introduced self and role and discussed discharge planning. Patient agreeable to return to ALF today. CSW spoke to staff at [facility] and they are ready to take patient back today. CSW sent discharge summary and FL2 to facility. CSW called patient's daughter and number not working; Patent attorney for patient's granddaughter, Caryl Pina. CSW to support with discharge.  Employment status:  Retired Forensic scientist:  Medicare PT Recommendations:  Not assessed at this time [place] / Referral to community resources:  Other (Comment Required)(back to ALF)  Patient/Family's Response to care: Patient appreciative of care.  Patient/Family's Understanding of and Emotional Response to Diagnosis, Current Treatment, and  Prognosis: Patient agreeable to return to ALF today.  Emotional Assessment Appearance:  Appears stated age Attitude/Demeanor/Rapport:  Lethargic, Engaged Affect (typically observed):  Accepting, Calm Orientation:  Oriented to Self, Oriented to Place, Oriented to  Time, Oriented to Situation Alcohol / Substance use:  Not Applicable Psych involvement (Current and /or in the community):  No (Comment)  Discharge Needs  Concerns to be addressed:  Care Coordination, Discharge Planning Concerns Readmission within the last 30 days:  No Current discharge risk:  None Barriers to Discharge:  No Barriers Identified   Estanislado Emms, LCSW 08/15/2017, 2:45 PM

## 2017-08-15 NOTE — Progress Notes (Addendum)
Patient will discharge back to [facility]. Anticipated discharge date: 08/15/17 Family notified: Ephraim Hamburger, daughter at bedside Transportation by: daughter will drive patient  Nurse to call report to [phone].   CSW signing off.  Estanislado Emms, [place]  Clinical Social Worker

## 2017-08-15 NOTE — Discharge Instructions (Signed)

## 2017-08-15 NOTE — NC FL2 (Signed)
[place] MEDICAID FL2 LEVEL OF CARE SCREENING TOOL     IDENTIFICATION  Patient Name: Emily Hardin Birthdate: 08/27/1916 Sex: female Admission Date (Current Location): 08/13/2017  [facility] and [place] Number:  Herbalist and Address:  [facility], [place] [address], [place], [place] [address]      Provider Number: [id number]  Attending Physician Name and Address:  Cristal Ford, DO  Relative Name and Phone Number:  Ephraim Hamburger, daughter, [phone]    Current Level of Care: Hospital Recommended Level of Care: [place] Prior Approval Number:    Date Approved/Denied:   PASRR Number: [id number]  Discharge Plan: Domiciliary (Rest home)(ALF)    Current Diagnoses: Patient Active Problem List   Diagnosis Date Noted  . Chest pain 08/13/2017  . Hyponatremia 08/13/2017  . HTN (hypertension) 06/09/2017  . Depression 06/09/2017    Orientation RESPIRATION BLADDER Height & Weight     Self, Time, Situation, Place  Normal Continent Weight: 141 lb 8.6 oz (64.2 kg) Height:  5\' 2"  (157.5 cm)  BEHAVIORAL SYMPTOMS/MOOD NEUROLOGICAL BOWEL NUTRITION STATUS      Continent Diet(please see DC summary)  AMBULATORY STATUS COMMUNICATION OF NEEDS Skin   Independent(baseline) Verbally Normal                       Personal Care Assistance Level of Assistance  Bathing, Feeding, Dressing(baseline)           Functional Limitations Info  Sight, Hearing, Speech Sight Info: Impaired Hearing Info: Adequate Speech Info: Adequate    SPECIAL CARE FACTORS FREQUENCY                       Contractures Contractures Info: Not present    Additional Factors Info  Code Status, Allergies, Psychotropic Code Status Info: DNR Allergies Info: No Known Allergies Psychotropic Info: celexa         Current Medications (08/15/2017):  This is the current hospital active medication list Current Facility-Administered Medications   Medication Dose Route Frequency Provider Last Rate Last Dose  . 0.9 %  sodium chloride infusion   Intravenous Continuous Cristal Ford, DO 50 mL/hr at 08/15/17 6270    . acetaminophen (TYLENOL) tablet 650 mg  650 mg Oral Q4H PRN Vianne Bulls, MD   650 mg at 08/15/17 0213  . aspirin EC tablet 81 mg  81 mg Oral Daily Opyd, Ilene Qua, MD   81 mg at 08/15/17 0943  . atenolol (TENORMIN) tablet 12.5 mg  12.5 mg Oral QHS Opyd, Ilene Qua, MD   12.5 mg at 08/14/17 2125  . atorvastatin (LIPITOR) tablet 40 mg  40 mg Oral q1800 Opyd, Ilene Qua, MD      . calcium carbonate (TUMS - dosed in mg elemental calcium) chewable tablet 200 mg of elemental calcium  1 tablet Oral Daily Opyd, Ilene Qua, MD   200 mg of elemental calcium at 08/15/17 0943  . citalopram (CELEXA) tablet 20 mg  20 mg Oral Daily Opyd, Ilene Qua, MD   20 mg at 08/15/17 0944  . heparin ADULT infusion 100 units/mL (25000 units/248mL sodium chloride 0.45%)  700 Units/hr Intravenous Continuous Cristal Ford, DO 7 mL/hr at 08/15/17 0516 700 Units/hr at 08/15/17 0516  . hydrALAZINE (APRESOLINE) tablet 20 mg  20 mg Oral BID Vianne Bulls, MD   20 mg at 08/15/17 0944  . ipratropium-albuterol (DUONEB) 0.5-2.5 (3) MG/3ML nebulizer solution 3 mL  3 mL Nebulization  Q6H PRN Opyd, Ilene Qua, MD      . lisinopril (PRINIVIL,ZESTRIL) tablet 20 mg  20 mg Oral Daily Opyd, Ilene Qua, MD   20 mg at 08/15/17 0944  . morphine 4 MG/ML injection 1-2 mg  1-2 mg Intravenous Q4H PRN Opyd, Ilene Qua, MD      . multivitamins with iron tablet 1 tablet  1 tablet Oral Daily Opyd, Ilene Qua, MD   1 tablet at 08/15/17 0944  . nitroGLYCERIN (NITROSTAT) SL tablet 0.4 mg  0.4 mg Sublingual Q5 Min x 3 PRN Opyd, Ilene Qua, MD      . ondansetron (ZOFRAN) injection 4 mg  4 mg Intravenous Q6H PRN Opyd, Ilene Qua, MD      . polyethylene glycol (MIRALAX / GLYCOLAX) packet 17 g  17 g Oral Daily Opyd, Ilene Qua, MD   17 g at 08/15/17 0945  . senna-docusate (Senokot-S) tablet 2  tablet  2 tablet Oral QHS Opyd, Ilene Qua, MD   2 tablet at 08/14/17 2126  . zolpidem (AMBIEN) tablet 2.5 mg  2.5 mg Oral QHS PRN Opyd, Ilene Qua, MD   2.5 mg at 08/15/17 0220     Discharge Medications: Please see discharge summary for a list of discharge medications.  Relevant Imaging Results:  Relevant Lab Results:   Additional Information SSN: [id number]  Estanislado Emms, LCSW

## 2017-08-15 NOTE — Discharge Summary (Addendum)
Physician Discharge Summary  Emily Hardin CWC:[id number] DOB: 1916-09-02 DOA: 08/13/2017  PCP: Emily Clamp, MD  Admit date: 08/13/2017 Discharge date: 08/15/2017  Time spent: 45 minutes  Recommendations for Outpatient Follow-up:  Patient will be discharged to assisted living facility.  Patient will need to follow up with primary care provider within one week of discharge, repeat BMP and discuss starting Plavix and statin medication, with possible referral to cardiology.  Patient should continue medications as prescribed.  Patient should follow a heart healthy diet.   Discharge Diagnoses:  Chest pain/NSTEMI Essential hypertension Mild hyponatremia Depression Insomnia Chronic Normocytic Anemia  Discharge Condition: Stable  Diet recommendation: Heart healthy  Code status: DNR  Filed Weights   08/13/17 2200 08/14/17 1135 08/15/17 0607  Weight: 64 kg (141 lb 1.5 oz) 62.3 kg (137 lb 6.4 oz) 64.2 kg (141 lb 8.6 oz)    History of present illness:  On 08/13/2017 by Dr. Darnelle Going Hardin a 84 y.o.femalewith medical history significant forhypertension, depression, insomnia, and chronic normocytic anemia, now presenting to the emergency department for evaluation of chest pain. The patient reports that she has been in her usual state of health and was having an uneventful day when she developed the acute onset of pain in the central and left chest with radiation to the left arm. She was at rest when symptoms developed and is unable to identify any alleviating or exacerbating factors. There was no associated shortness of breath, nausea, or diaphoresis. She has never experienced this previously. EMS was called and the patient was treated with aspirin and 1 nitroglycerin prior to arrival in the ED.  Hospital Course:  Chest pain/NSTEMI -Patient developed acute chest pain with radiation to the back and left arm at rest -Patient was given nitroglycerin and aspirin  by EMS prior to admission -Chest X-ray unremarkable -EKG without any ischemic features appreciated- reviewed shows sinus bradycardia rate 54, first-degree AV block -Troponin elevated, 0.04, 3.45, 6.8, 6.55 -Patient was started on heparin -Patient does not wish for any invasive procedures at this time, has opted for medical management -Discussed with patient the use of Plavix, she would like to think about it and discuss this with her daughter given that she is 55 years old and there is an increased risk of bleeding -Continue aspirin, atenolol, lisinopril -Echocardiogram EF 55 to 60%, no regional wall motion abnormalities.  Moderate mitral regurgitation.  Mild aortic regurgitation. -Lipid panel: Total cholesterol 129, HDL 62, LDL 87, triglycerides 50 -Discussed with cardiology via phone, Dr. Irish Lack, recommended use of Plavix per guidelines, however I relayed patient's apprehension. Recommended heparin for 48 hours. No further recommendations.  -Patient refused statin overnight.  Discussed the need for statin as well as Plavix.  Patient would like to discuss this further with her primary care physician.  Essential hypertension -Currently stable  -continue hydralazine, lisinopril, atenolol -HCTZ held due to hyponatremia  Mild hyponatremia -Patient appears to be euvolemic -placed on gentle IVF -currently neurologically intact  -Repeat BMP in one week  Depression -Continue Celexa  Insomnia -Continue Ambien  Chronic Normocytic Anemia -Hemoglobin currently 11, upon review of patient's chart hemoglobin has been between 8 and 9 although the only laboratory values available are from March 2019 -Anemia panel: Iron 126, saturation ratio 42, ferritin 26, folate 61.1, B12 406 -Hemoglobin currently 10.1  Procedures: Echocardiogram  Consultations: Cardiology, via phone  Discharge Exam: Vitals:   08/14/17 2045 08/15/17 0607  BP: 108/63 138/65  Pulse: 61 (!) 53  Resp: 16 15  Temp:  98.2 F (36.8 C) 98 F (36.7 C)  SpO2: 96% 97%   Patient with no complaints today.  Denies current chest pain, shortness breath, abdominal pain, nausea or vomiting, diarrhea or vision, headache or dizziness.  Patient very apprehensive regarding starting Plavix and statin.  Would like to speak to her PCP regarding these things.   General: Well developed, well nourished, elderly, NAD  HEENT: NCAT, mucous membranes moist.  Neck: Supple  Cardiovascular: S1 S2 auscultated, no rubs, murmurs or gallops. Regular rate and rhythm.  Respiratory: Clear to auscultation bilaterally with equal chest rise  Abdomen: Soft, nontender, nondistended, + bowel sounds  Extremities: warm dry without cyanosis clubbing or edema  Neuro: AAOx3, nonfocal  Psych:, appropriate mood and affect intact judgment and insight.  Discharge Instructions Discharge Instructions    Discharge instructions   Complete by:  As directed    Patient will be discharged to assisted living facility.  Patient will need to follow up with primary care provider within one week of discharge, repeat BMP and discuss starting Plavix and statin medication, with possible referral to cardiology.  Patient should continue medications as prescribed.  Patient should follow a heart healthy diet.     Allergies as of 08/15/2017   No Known Allergies     Medication List    STOP taking these medications   lisinopril-hydrochlorothiazide 20-12.5 MG tablet Commonly known as:  PRINZIDE,ZESTORETIC     TAKE these medications   acetaminophen 325 MG tablet Commonly known as:  TYLENOL Take 650 mg by mouth See admin instructions. Take two tablets (650 MG) at bedtime, patient may also take two tablets every six hours as needed for pain or fever.   aspirin 81 MG EC tablet Take 1 tablet (81 mg total) by mouth daily. Start taking on:  08/16/2017   atenolol 25 MG tablet Commonly known as:  TENORMIN Take 12.5 mg by mouth at bedtime. Take 0.5 tablet (12.5  mg) in the am and Take 1 tablet (25 mg) in the pm.   BIOFREEZE 4 % Gel Generic drug:  Menthol (Topical Analgesic) Apply 1 application topically at bedtime. To knees   CAL-GEST ANTACID 500 MG chewable tablet Generic drug:  calcium carbonate Chew 1 tablet by mouth daily.   citalopram 20 MG tablet Commonly known as:  CELEXA Take 20 mg by mouth daily.   hydrALAZINE 10 MG tablet Commonly known as:  APRESOLINE Take 20 mg by mouth 2 (two) times daily.   ipratropium-albuterol 0.5-2.5 (3) MG/3ML Soln Commonly known as:  DUONEB Take 3 mLs by nebulization every 6 (six) hours as needed (for shortness of breath or wheezing).   lisinopril 20 MG tablet Commonly known as:  PRINIVIL,ZESTRIL Take 1 tablet (20 mg total) by mouth daily. Start taking on:  08/16/2017   loratadine 10 MG tablet Commonly known as:  CLARITIN Take 10 mg by mouth at bedtime.   Melatonin 3 MG Tabs Take 3 mg by mouth at bedtime.   multivitamin-prenatal 27-0.8 MG Tabs tablet Take 1 tablet by mouth daily.   multivitamins with iron Tabs tablet Take 1 tablet by mouth daily.   polyethylene glycol packet Commonly known as:  MIRALAX / GLYCOLAX Take 17 g by mouth daily.   SENEXON-S 8.6-50 MG tablet Generic drug:  senna-docusate Take 2 tablets by mouth at bedtime.   vitamin A 8000 UNIT capsule Take 8,000 Units by mouth daily.   zolpidem 5 MG tablet Commonly known as:  AMBIEN Take 2.5 mg by mouth at bedtime as needed  for sleep.      No Known Allergies Follow-up Information    Emily Clamp, MD. Schedule an appointment as soon as possible for a visit in 1 week(s).   Specialty:  Family Medicine Why:  Hospital follow up Contact information: [address] [place] [address] [phone]            The results of significant diagnostics from this hospitalization (including imaging, microbiology, ancillary and laboratory) are listed below for reference.    Significant Diagnostic  Studies: Dg Chest 2 View  Result Date: 08/13/2017 CLINICAL DATA:  Centralized chest and LEFT arm pain beginning at noon today. Recent aspiration pneumonia. History of cancer. EXAM: CHEST - 2 VIEW COMPARISON:  Chest radiograph June 11, 2017 FINDINGS: Cardiac silhouette is moderately enlarged and unchanged. Calcified aortic knob. No pleural effusion or focal consolidation. Bandlike density RIGHT lung base. No pneumothorax. Scattered granulomata. LEFT apical calcified pleural plaque. Old RIGHT mid clavicle fracture. Old LEFT rib fractures. Old mild approximate L2 compression fracture. Osteopenia. Calcifications in the neck are likely vascular. IMPRESSION: 1. RIGHT lower lobe atelectasis/scarring. 2. Stable cardiomegaly. 3.  Aortic Atherosclerosis (ICD10-I70.0). Electronically Signed   By: Elon Alas M.D.   On: 08/13/2017 15:53    Microbiology: No results found for this or any previous visit (from the past 240 hour(s)).   Labs: Basic Metabolic Panel: Recent Labs  Lab 08/13/17 1622 08/14/17 1345 08/15/17 0327 08/15/17 1112  NA 133*  --  130* 131*  K 4.3  --  3.8  --   CL 97*  --  100*  --   CO2 22  --  24  --   GLUCOSE 111*  --  107*  --   BUN 22*  --  14  --   CREATININE 0.88  --  0.86  --   CALCIUM 9.8  --  8.6*  --   MG  --  1.7  --   --    Liver Function Tests: No results for input(s): AST, ALT, ALKPHOS, BILITOT, PROT, ALBUMIN in the last 168 hours. No results for input(s): LIPASE, AMYLASE in the last 168 hours. No results for input(s): AMMONIA in the last 168 hours. CBC: Recent Labs  Lab 08/13/17 1713 08/15/17 0327  WBC 6.9 6.6  NEUTROABS 5.7  --   HGB 11.0* 10.1*  HCT 34.9* 31.5*  MCV 85.7 85.8  PLT 193 178   Cardiac Enzymes: Recent Labs  Lab 08/13/17 1622 08/13/17 2235 08/14/17 0355 08/14/17 1004  TROPONINI 0.04* 3.45* 6.80* 6.55*   BNP: BNP (last 3 results) No results for input(s): BNP in the last 8760 hours.  ProBNP (last 3 results) No results for  input(s): PROBNP in the last 8760 hours.  CBG: No results for input(s): GLUCAP in the last 168 hours.     Signed:  Cristal Ford  [facility] 08/15/2017, 1:14 PM

## 2017-08-15 NOTE — Progress Notes (Signed)
ANTICOAGULATION CONSULT NOTE  Pharmacy Consult for heparin Indication: chest pain/ACS  No Known Allergies  Patient Measurements: Height: 5\' 2"  (157.5 cm) Weight: 137 lb 6.4 oz (62.3 kg) IBW/kg (Calculated) : 50.1 Heparin Dosing Weight: 63 kg  Vital Signs: Temp: 98.2 F (36.8 C) (05/30 2045) Temp Source: Oral (05/30 2045) BP: 108/63 (05/30 2045) Pulse Rate: 61 (05/30 2045)  Labs: Recent Labs    08/13/17 1622 08/13/17 1713 08/13/17 2235 08/14/17 0355 08/14/17 1004 08/14/17 2005 08/15/17 0327  HGB  --  11.0*  --   --   --   --  10.1*  HCT  --  34.9*  --   --   --   --  31.5*  PLT  --  193  --   --   --   --  178  HEPARINUNFRC  --   --   --   --  0.78* 0.32 0.27*  CREATININE 0.88  --   --   --   --   --   --   TROPONINI 0.04*  --  3.45* 6.80* 6.55*  --   --     Estimated Creatinine Clearance: 30.3 mL/min (by C-G formula based on SCr of 0.88 mg/dL).   Medical History: Past Medical History:  Diagnosis Date  . Aspiration pneumonia ([place])   . Cancer ([place])   . Chest pain 08/14/2017  . Depression   . Encephalopathy   . HOH (hard of hearing)   . Hypertension   . Legally blind     Assessment: 18 yoF admitted with chest pain. Pharmacy consulted to start heparin for ACS. Elevated troponin. No AC PTA.   Heparin level this now 0.27 units/ml  Goal of Therapy:  Heparin level 0.3-0.7 units/ml Monitor platelets by anticoagulation protocol: Yes   Plan:  Increase heparin infusion to 700 units/hr Monitor daily heparin level and CBC, s/sx bleeding   Excell Seltzer, PharmD Clinical Pharmacist  08/15/2017 5:02 AM

## 2017-10-22 ENCOUNTER — Telehealth: Payer: Self-pay

## 2017-10-22 NOTE — Telephone Encounter (Signed)
FAXED NOTES TO [facility] 

## 2017-11-07 ENCOUNTER — Encounter: Payer: Self-pay | Admitting: Cardiology

## 2017-11-07 ENCOUNTER — Ambulatory Visit (INDEPENDENT_AMBULATORY_CARE_PROVIDER_SITE_OTHER): Payer: Medicare Other | Admitting: Cardiology

## 2017-11-07 VITALS — BP 150/64 | HR 64 | Ht 61.0 in | Wt 149.0 lb

## 2017-11-07 DIAGNOSIS — I10 Essential (primary) hypertension: Secondary | ICD-10-CM | POA: Diagnosis not present

## 2017-11-07 MED ORDER — AMLODIPINE BESYLATE 5 MG PO TABS: 5.0000 mg | ORAL_TABLET | Freq: Every day | ORAL | 3 refills | Status: DC

## 2017-11-07 NOTE — Patient Instructions (Addendum)
Medication Instructions:  Your physician has recommended you make the following change in your medication:   STOP: Hydralazine   START: Amlodipine 5 mg  daily.   Labwork: None.  Testing/Procedures: None.  Follow-Up: Your physician recommends that you schedule a follow-up appointment in: 3 months.    Any Other Special Instructions Will Be Listed Below (If Applicable).\   Please check you blood pressure twice daily for 2 weeks and have the readings faxed to [place]. ATTN: Dr. Geraldo Pitter.      If you need a refill on your cardiac medications before your next appointment, please call your pharmacy.    Amlodipine tablets What is this medicine? AMLODIPINE (am LOE di peen) is a calcium-channel blocker. It affects the amount of calcium found in your heart and muscle cells. This relaxes your blood vessels, which can reduce the amount of work the heart has to do. This medicine is used to lower high blood pressure. It is also used to prevent chest pain. This medicine may be used for other purposes; ask your health care provider or pharmacist if you have questions. COMMON BRAND NAME(S): Norvasc What should I tell my health care provider before I take this medicine? They need to know if you have any of these conditions: -heart problems like heart failure or aortic stenosis -liver disease -an unusual or allergic reaction to amlodipine, other medicines, foods, dyes, or preservatives -pregnant or trying to get pregnant -breast-feeding How should I use this medicine? Take this medicine by mouth with a glass of water. Follow the directions on the prescription label. Take your medicine at regular intervals. Do not take more medicine than directed. Talk to your pediatrician regarding the use of this medicine in children. Special care may be needed. This medicine has been used in children as young as 6. Persons over 65 years old may have a stronger reaction to this medicine and need smaller  doses. Overdosage: If you think you have taken too much of this medicine contact a poison control center or emergency room at once. NOTE: This medicine is only for you. Do not share this medicine with others. What if I miss a dose? If you miss a dose, take it as soon as you can. If it is almost time for your next dose, take only that dose. Do not take double or extra doses. What may interact with this medicine? -herbal or dietary supplements -local or general anesthetics -medicines for high blood pressure -medicines for prostate problems -rifampin This list may not describe all possible interactions. Give your health care provider a list of all the medicines, herbs, non-prescription drugs, or dietary supplements you use. Also tell them if you smoke, drink alcohol, or use illegal drugs. Some items may interact with your medicine. What should I watch for while using this medicine? Visit your doctor or health care professional for regular check ups. Check your blood pressure and pulse rate regularly. Ask your health care professional what your blood pressure and pulse rate should be, and when you should contact him or her. This medicine may make you feel confused, dizzy or lightheaded. Do not drive, use machinery, or do anything that needs mental alertness until you know how this medicine affects you. To reduce the risk of dizzy or fainting spells, do not sit or stand up quickly, especially if you are an older patient. Avoid alcoholic drinks; they can make you more dizzy. Do not suddenly stop taking amlodipine. Ask your doctor or health care professional how you  can gradually reduce the dose. What side effects may I notice from receiving this medicine? Side effects that you should report to your doctor or health care professional as soon as possible: -allergic reactions like skin rash, itching or hives, swelling of the face, lips, or tongue -breathing problems -changes in vision or hearing -chest  pain -fast, irregular heartbeat -swelling of legs or ankles Side effects that usually do not require medical attention (report to your doctor or health care professional if they continue or are bothersome): -dry mouth -facial flushing -nausea, vomiting -stomach gas, pain -tired, weak -trouble sleeping This list may not describe all possible side effects. Call your doctor for medical advice about side effects. You may report side effects to FDA at 1-800-FDA-1088. Where should I keep my medicine? Keep out of the reach of children. Store at room temperature between 59 and 86 degrees F (15 and 30 degrees C). Protect from light. Keep container tightly closed. Throw away any unused medicine after the expiration date. NOTE: This sheet is a summary. It may not cover all possible information. If you have questions about this medicine, talk to your doctor, pharmacist, or health care provider.  2018 Elsevier/Gold Standard (2012-01-31 11:40:58)

## 2017-11-07 NOTE — Progress Notes (Signed)
Cardiology Office Note:    Date:  11/07/2017   ID:  Emily Hardin, DOB 1917/01/25, MRN [id number]  PCP:  Shawna Clamp, MD  Cardiologist:  Jenean Lindau, MD   Referring MD: Shawna Clamp, MD    ASSESSMENT:    1. Essential hypertension    PLAN:    In order of problems listed above:  1. I discussed my findings with the patient at extensive length.  She is hemodynamically stable at this time.  I have made certain changes in her blood pressure medications and diet was also explained.  I have stopped hydralazine and would like to simplify her blood pressure medications.  I will introduce amlodipine 5 mg daily and she will keep a track of her blood pressures twice a day and send it was in a week at her facility. 2. I reviewed echocardiogram and discussed findings with the patient. 3. Her dentist wants to know whether she is stable from a cardiac standpoint for dental extraction.  This is a challenging question.  I do not know the patient's cardiac status any better than my evaluation today.  She is asymptomatic.  She leads a sedentary lifestyle.  I do not see any reason to put patient of this age to any stressful evaluation such as a stress test for assessment of her cardiac condition in view of her dental extraction procedure.  Therefore in performing any procedures on this lady such as dental extraction one will have to be prudent making such decisions and involved with the patient and the family and benefits risk evaluations.  This is the best I can see from a cardiovascular standpoint.  To me at this point it seems like sensible approach to addressing medical care of a lady who is so well preserved at such an advanced age.  Also the issues such as blood pressure fluctuations during dental extractions or any other associated pain may put her at further risks for unforeseen events. 4. I discussed this extensively with her granddaughter and the patient in the vocalized  understanding. 5. Patient will be seen in follow-up appointment in 4 months or earlier if the patient has any concerns    Medication Adjustments/Labs and Tests Ordered: Current medicines are reviewed at length with the patient today.  Concerns regarding medicines are outlined above.  No orders of the defined types were placed in this encounter.  Meds ordered this encounter  Medications  . amLODipine (NORVASC) 5 MG tablet    Sig: Take 1 tablet (5 mg total) by mouth daily.    Dispense:  90 tablet    Refill:  3     History of Present Illness:    Emily Hardin is a 84 y.o. female who is being seen today for the evaluation of essential hypertension at the request of Shawna Clamp, MD.  Patient is a pleasant 84 year old female.  She has past medical history of essential hypertension.  She has not moved from [facility] to this area.  She denies any problems at this time she leads a sedentary lifestyle.  She is an amazingly sharp woman and is very much with it.  Is accompanied by her granddaughter.  Past Medical History:  Diagnosis Date  . Aspiration pneumonia ([place])   . Cancer ([place])   . Chest pain 08/14/2017  . Depression   . Encephalopathy   . HOH (hard of hearing)   . Hypertension   . Legally blind     Past Surgical History:  Procedure Laterality  Date  . ABDOMINAL HYSTERECTOMY    . CESAREAN SECTION    . SHOULDER SURGERY      Current Medications: Current Meds  Medication Sig  . acetaminophen (TYLENOL) 325 MG tablet Take 650 mg by mouth See admin instructions. Take two tablets (650 MG) at bedtime, patient may also take two tablets every six hours as needed for pain or fever.  Marland Kitchen aspirin EC 81 MG EC tablet Take 1 tablet (81 mg total) by mouth daily.  Marland Kitchen atenolol (TENORMIN) 25 MG tablet Take 12.5 mg by mouth at bedtime. Take 0.5 tablet (12.5 mg) in the am and Take 1 tablet (25 mg) in the pm.  . citalopram (CELEXA) 20 MG tablet Take 20 mg by mouth daily.   Marland Kitchen  lisinopril (PRINIVIL,ZESTRIL) 20 MG tablet Take 1 tablet (20 mg total) by mouth daily.  Marland Kitchen loratadine (CLARITIN) 10 MG tablet Take 10 mg by mouth at bedtime.  . Melatonin 3 MG TABS Take 3 mg by mouth at bedtime.  . Menthol, Topical Analgesic, (BIOFREEZE) 4 % GEL Apply 1 application topically at bedtime. To knees  . Multiple Vitamins-Iron (MULTIVITAMINS WITH IRON) TABS tablet Take 1 tablet by mouth daily.  . polyethylene glycol (MIRALAX / GLYCOLAX) packet Take 17 g by mouth daily.  . vitamin A 8000 UNIT capsule Take 8,000 Units by mouth daily.  Marland Kitchen zolpidem (AMBIEN) 5 MG tablet Take 2.5 mg by mouth at bedtime as needed for sleep.  . [DISCONTINUED] hydrALAZINE (APRESOLINE) 10 MG tablet Take 20 mg by mouth 2 (two) times daily.     Allergies:   Patient has no known allergies.   Social History   Socioeconomic History  . Marital status: Widowed    Spouse name: Not on file  . Number of children: Not on file  . Years of education: Not on file  . Highest education level: Not on file  Occupational History  . Not on file  Social Needs  . Financial resource strain: Not on file  . Food insecurity:    Worry: Not on file    Inability: Not on file  . Transportation needs:    Medical: Not on file    Non-medical: Not on file  Tobacco Use  . Smoking status: Former Smoker    Last attempt to quit: 06/10/1987    Years since quitting: 30.4  . Smokeless tobacco: Never Used  Substance and Sexual Activity  . Alcohol use: Yes    Alcohol/week: 1.0 standard drinks    Types: 1 Glasses of wine per week    Comment: 1 glass wine daily  . Drug use: Never  . Sexual activity: Not on file  Lifestyle  . Physical activity:    Days per week: Not on file    Minutes per session: Not on file  . Stress: Not on file  Relationships  . Social connections:    Talks on phone: Not on file    Gets together: Not on file    Attends religious service: Not on file    Active member of club or organization: Not on file     Attends meetings of clubs or organizations: Not on file    Relationship status: Not on file  Other Topics Concern  . Not on file  Social History Narrative  . Not on file     Family History: The patient's family history includes Heart attack in her father; Obesity in her daughter.  ROS:   Please see the history of present illness.  All other systems reviewed and are negative.  EKGs/Labs/Other Studies Reviewed:    The following studies were reviewed today: I reviewed notes and EKG is unremarkable   Recent Labs: 06/10/2017: ALT 8 08/14/2017: Magnesium 1.7 08/15/2017: BUN 14; Creatinine, Ser 0.86; Hemoglobin 10.1; Platelets 178; Potassium 3.8; Sodium 131  Recent Lipid Panel    Component Value Date/Time   CHOL 159 08/15/2017 0327   TRIG 50 08/15/2017 0327   HDL 62 08/15/2017 0327   CHOLHDL 2.6 08/15/2017 0327   VLDL 10 08/15/2017 0327   LDLCALC 87 08/15/2017 0327    Physical Exam:    VS:  BP (!) 150/64   Pulse 64   Ht 5\' 1"  (1.549 m)   Wt 149 lb (67.6 kg)   SpO2 93%   BMI 28.15 kg/m     Wt Readings from Last 3 Encounters:  11/07/17 149 lb (67.6 kg)  08/15/17 141 lb 8.6 oz (64.2 kg)  06/09/17 160 lb (72.6 kg)     GEN: Patient is in no acute distress HEENT: Normal NECK: No JVD; No carotid bruits LYMPHATICS: No lymphadenopathy CARDIAC: S1 S2 regular, 2/6 systolic murmur at the apex. RESPIRATORY:  Clear to auscultation without rales, wheezing or rhonchi  ABDOMEN: Soft, non-tender, non-distended MUSCULOSKELETAL:  No edema; No deformity  SKIN: Warm and dry NEUROLOGIC:  Alert and oriented x 3 PSYCHIATRIC:  Normal affect    Signed, Jenean Lindau, MD  11/07/2017 2:53 PM    [place]

## 2018-04-07 ENCOUNTER — Other Ambulatory Visit: Payer: Self-pay

## 2018-04-07 ENCOUNTER — Encounter (HOSPITAL_COMMUNITY): Payer: Self-pay | Admitting: Emergency Medicine

## 2018-04-07 ENCOUNTER — Inpatient Hospital Stay (HOSPITAL_COMMUNITY)
Admission: EM | Admit: 2018-04-07 | Discharge: 2018-04-11 | DRG: 381 | Disposition: A | Discharge status: Skilled Nursing Facility | Payer: Medicare Other | Source: Skilled Nursing Facility | Attending: Internal Medicine | Admitting: Internal Medicine

## 2018-04-07 ENCOUNTER — Emergency Department (HOSPITAL_COMMUNITY): Payer: Medicare Other

## 2018-04-07 DIAGNOSIS — Z8541 Personal history of malignant neoplasm of cervix uteri: Secondary | ICD-10-CM

## 2018-04-07 DIAGNOSIS — Z87891 Personal history of nicotine dependence: Secondary | ICD-10-CM

## 2018-04-07 DIAGNOSIS — D62 Acute posthemorrhagic anemia: Secondary | ICD-10-CM | POA: Diagnosis present

## 2018-04-07 DIAGNOSIS — Z66 Do not resuscitate: Secondary | ICD-10-CM | POA: Diagnosis present

## 2018-04-07 DIAGNOSIS — I252 Old myocardial infarction: Secondary | ICD-10-CM

## 2018-04-07 DIAGNOSIS — Z8249 Family history of ischemic heart disease and other diseases of the circulatory system: Secondary | ICD-10-CM | POA: Diagnosis not present

## 2018-04-07 DIAGNOSIS — Z90710 Acquired absence of both cervix and uterus: Secondary | ICD-10-CM

## 2018-04-07 DIAGNOSIS — Z7982 Long term (current) use of aspirin: Secondary | ICD-10-CM

## 2018-04-07 DIAGNOSIS — K2211 Ulcer of esophagus with bleeding: Principal | ICD-10-CM | POA: Diagnosis present

## 2018-04-07 DIAGNOSIS — K2270 Barrett's esophagus without dysplasia: Secondary | ICD-10-CM | POA: Diagnosis not present

## 2018-04-07 DIAGNOSIS — K449 Diaphragmatic hernia without obstruction or gangrene: Secondary | ICD-10-CM | POA: Diagnosis present

## 2018-04-07 DIAGNOSIS — I951 Orthostatic hypotension: Secondary | ICD-10-CM | POA: Diagnosis present

## 2018-04-07 DIAGNOSIS — D649 Anemia, unspecified: Secondary | ICD-10-CM | POA: Diagnosis present

## 2018-04-07 DIAGNOSIS — H9190 Unspecified hearing loss, unspecified ear: Secondary | ICD-10-CM | POA: Diagnosis present

## 2018-04-07 DIAGNOSIS — H548 Legal blindness, as defined in USA: Secondary | ICD-10-CM | POA: Diagnosis present

## 2018-04-07 DIAGNOSIS — I2510 Atherosclerotic heart disease of native coronary artery without angina pectoris: Secondary | ICD-10-CM | POA: Diagnosis present

## 2018-04-07 DIAGNOSIS — Z85828 Personal history of other malignant neoplasm of skin: Secondary | ICD-10-CM | POA: Diagnosis not present

## 2018-04-07 DIAGNOSIS — I10 Essential (primary) hypertension: Secondary | ICD-10-CM | POA: Diagnosis present

## 2018-04-07 DIAGNOSIS — K2210 Ulcer of esophagus without bleeding: Secondary | ICD-10-CM | POA: Diagnosis present

## 2018-04-07 DIAGNOSIS — Z9889 Other specified postprocedural states: Secondary | ICD-10-CM | POA: Diagnosis not present

## 2018-04-07 DIAGNOSIS — R011 Cardiac murmur, unspecified: Secondary | ICD-10-CM | POA: Diagnosis not present

## 2018-04-07 DIAGNOSIS — Z01818 Encounter for other preprocedural examination: Secondary | ICD-10-CM | POA: Diagnosis not present

## 2018-04-07 DIAGNOSIS — Z79899 Other long term (current) drug therapy: Secondary | ICD-10-CM | POA: Diagnosis not present

## 2018-04-07 LAB — BASIC METABOLIC PANEL
ANION GAP: 8 (ref 5–15)
BUN: 50 mg/dL — ABNORMAL HIGH (ref 8–23)
CO2: 23 mmol/L (ref 22–32)
Calcium: 8.7 mg/dL — ABNORMAL LOW (ref 8.9–10.3)
Chloride: 105 mmol/L (ref 98–111)
Creatinine, Ser: 0.9 mg/dL (ref 0.44–1.00)
GFR, EST NON AFRICAN AMERICAN: 53 mL/min — AB (ref >60)
GLUCOSE: 125 mg/dL — AB (ref 70–99)
POTASSIUM: 4.5 mmol/L (ref 3.5–5.1)
Sodium: 136 mmol/L (ref 135–145)

## 2018-04-07 LAB — CBC
HCT: 18.9 % — ABNORMAL LOW (ref 36.0–46.0)
HEMOGLOBIN: 5.8 g/dL — AB (ref 12.0–15.0)
MCH: 31.5 pg (ref 26.0–34.0)
MCHC: 30.7 g/dL (ref 30.0–36.0)
MCV: 102.7 fL — ABNORMAL HIGH (ref 80.0–100.0)
NRBC: 0 % (ref 0.0–0.2)
PLATELETS: 154 10*3/uL (ref 150–400)
RBC: 1.84 MIL/uL — AB (ref 3.87–5.11)
RDW: 13.6 % (ref 11.5–15.5)
WBC: 13 10*3/uL — ABNORMAL HIGH (ref 4.0–10.5)

## 2018-04-07 LAB — POC OCCULT BLOOD, ED: Fecal Occult Bld: POSITIVE — AB

## 2018-04-07 LAB — PREPARE RBC (CROSSMATCH)

## 2018-04-07 LAB — PROTIME-INR
INR: 1.07
Prothrombin Time: 13.8 seconds (ref 11.4–15.2)

## 2018-04-07 LAB — ABO/RH: ABO/RH(D): A POS

## 2018-04-07 MED ORDER — ACETAMINOPHEN 650 MG RE SUPP: 650.0000 mg | Freq: Four times a day (QID) | RECTAL | Status: DC | PRN

## 2018-04-07 MED ORDER — CITALOPRAM HYDROBROMIDE 20 MG PO TABS
20.0000 mg | ORAL_TABLET | Freq: Every day | ORAL | Status: DC
  Administered 2018-04-08 – 2018-04-11 (×4): 20 mg via ORAL
  Filled 2018-04-07 (×4): qty 1

## 2018-04-07 MED ORDER — ACETAMINOPHEN 325 MG PO TABS
650.0000 mg | ORAL_TABLET | Freq: Every evening | ORAL | Status: DC | PRN
  Administered 2018-04-07 – 2018-04-10 (×3): 650 mg via ORAL
  Filled 2018-04-07: qty 2

## 2018-04-07 MED ORDER — MELATONIN 3 MG PO TABS
3.0000 mg | ORAL_TABLET | Freq: Every day | ORAL | Status: DC
  Administered 2018-04-07 – 2018-04-10 (×4): 3 mg via ORAL
  Filled 2018-04-07 (×4): qty 1

## 2018-04-07 MED ORDER — SODIUM CHLORIDE 0.9 % IV BOLUS: 1000.0000 mL | Freq: Once | INTRAVENOUS | Status: DC

## 2018-04-07 MED ORDER — ACETAMINOPHEN 325 MG PO TABS
650.0000 mg | ORAL_TABLET | Freq: Four times a day (QID) | ORAL | Status: DC | PRN
  Filled 2018-04-07 (×3): qty 2

## 2018-04-07 MED ORDER — SODIUM CHLORIDE 0.9% IV SOLUTION: Freq: Once | INTRAVENOUS | Status: DC

## 2018-04-07 MED ORDER — PANTOPRAZOLE SODIUM 40 MG IV SOLR
40.0000 mg | Freq: Two times a day (BID) | INTRAVENOUS | Status: DC
  Administered 2018-04-07 – 2018-04-09 (×5): 40 mg via INTRAVENOUS
  Filled 2018-04-07 (×6): qty 40

## 2018-04-07 MED ORDER — ZOLPIDEM TARTRATE 5 MG PO TABS
2.5000 mg | ORAL_TABLET | Freq: Every evening | ORAL | Status: DC | PRN
  Administered 2018-04-07 – 2018-04-10 (×4): 2.5 mg via ORAL
  Filled 2018-04-07 (×4): qty 1

## 2018-04-07 MED ORDER — ACETAMINOPHEN 325 MG PO TABS: 650.0000 mg | ORAL_TABLET | ORAL | Status: DC

## 2018-04-07 MED ORDER — SODIUM CHLORIDE 0.9% FLUSH
3.0000 mL | Freq: Two times a day (BID) | INTRAVENOUS | Status: DC
  Administered 2018-04-07 – 2018-04-11 (×7): 3 mL via INTRAVENOUS

## 2018-04-07 NOTE — H&P (Signed)
Date: 04/07/2018               Patient Name:  Emily Hardin MRN: [id number]  DOB: 11/07/1916 Age / Sex: 85 y.o., female   PCP: Emily Clamp, MD              Medical Service: Internal Medicine Teaching Service              Attending Physician: Dr. Lucious Groves, DO    First Contact: Emily Hardin, [place] [address] Pager: [phone]  Second Contact: Dr. Modena Hardin Pager: [phone]  Third Contact Dr. Amado Hardin Pager: [phone]       After Hours (After 5p/  First Contact Pager: [phone]  weekends / holidays): Second Contact Pager: [phone]   Chief Complaint: Dizziness  History of Present Illness: Ms Emily Hardin is a 85 year old female with hx of HTN, recent hospital admission for RSV pneumonia, and hx of cervical cancer s/p total abdominal hysterectomy  who presents today for new onset symptomatic anemia including orthostatic hypotension, dizziness, and nausea. She states that she is currently staying in a rehabilitation facility to get stronger after her most recent hospital admission for RSV pneumonia. She has been participating in PT and getting around fine until yesterday when she noticed that her head started spinning when she was straining with a bowel movement. During this episode she said the room was spinning and she felt a little nauseous so she put her head down and took several deep breaths and the episode resolved. After the episode she felt very weak which was a big change from earlier in the week when she was completing PT where she walked around and went to exercise classes all on her own. She had a second similar episode this morning and the rehab facility subsequently drew her blood and found her to have a low Hgb. She denies any regular use of NSAIDs or Goodies powder, and was only prescribed Inhaled steroids after her pneumonia but has not taken any oral steroids. She also denies any regular use of alcohol.   She denies any HA, chest pain or palpatations, abdominal pain,  changes in urination (burning/hematuria), changes in bowel movement (although she does not look at her stools so can not tell use the color or consistency). She confirms a mild cough and shortness of breath which were residual from her recent bout with pneumonia.   In the ED, blood work showed a HgB of 5.8 (baseline 10) with Hct of 18.9 and MCV of 102.7. BUN was also noted to be elevated to 50.    Allergies: Allergies as of 04/07/2018  . (No Known Allergies)   Past Medical History:  Diagnosis Date  . Aspiration pneumonia ([place])   . Cancer [facility] Cervical cancer in her 40s and Skin cancer removed   . Chest pain 08/14/2017  . Depression   . Encephalopathy   . HOH (hard of hearing)   . Hypertension   . Legally blind    Past Surgical History:  Procedure Laterality Date  . ABDOMINAL HYSTERECTOMY    . CESAREAN SECTION    . SHOULDER SURGERY     Family History  Problem Relation Age of Onset  . Obesity Daughter   . Heart attack Father    Social History   Socioeconomic History  . Marital status: Widowed  . Smoking status: Former Smoker    Last attempt to quit: 06/10/1987    Years since quitting: 30.8  . Smokeless tobacco: Never Used  Substance and Sexual Activity  . Alcohol use: Yes    Alcohol/week: 1.0 standard drinks    Types: 1 Glasses of wine per week    Comment: 1 glass wine daily  . Drug use: Never  . Sexual activity: Not on file  Narrative: she currently is in a rehab facility after a recent hospital admission but she lives alone in an independent living facility in [place]. She completes all of her ADLs and recently moved to the area from [place]. 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   Review of Systems: Pertinent items are noted in HPI.  Physical Exam: Blood pressure (!) 116/103, pulse 77, temperature 98.9 F (37.2 C), temperature source Oral, resp. rate 16, SpO2 100 %. General appearance: alert, cooperative and no distress, lying comfortably in  bed Eyes: conjunctival pallor, no scleral icterus Throat: Mucus membranes moist  Lungs: CTAB anteriorly, no increased work of breathing, no crackles/wheezes/rhonchi noted Heart: RRR, no m/r/g Abdomen: soft, nondistended, normal bowel sounds, non tender to palpation in all quadrants Extremities: No LE edema, mild TTP up to knee 2/2 neuropathy Neuro: A&Ox3; answers questions and follows commands appropriately; no focal deficits   Lab results: CBC: Hgb 5.8, Hct 18.9, WBC 13.0, MCV 102.7 BMP: BUN 50 FOBT Positive PT/INR: 13.8/1.07  UA pending  Other results: EKG sinus rhythm with prolonged PR interval, no ST seg changes or signs of new onset Afib.   Assessment & Plan by Problem: Active Problems:   Symptomatic anemia  1. Symptomatic Anemia: in the ED, ms Emily Hardin was found to have a Hgb of 5.8 with macrocytosis most likely due to bone marrow compensation for decreased Hgb. She received a bolus of NS and was transfused 2 units of blood to treat her anemia. Upon further review of her labs she also has elevated  BUN (50) and postive FOBT which is indicative of an Upper GI bleed. She does not have significant NSAID use or alcohol use but did have a recent admission to the hospital which could have caused a stress induced ulcer. Other causes of macrocytosis include Vitamin B12 and Folate deficiency, this seems unlikely as she has been eating normally but will get labs to rule this out. She has baseline anemia but the acute worsening fits more of a acute bleed than chronic decline.  We will consult GI to identify the source of bleed.   -Consulted GI -NPO -Protonix 40 Mg IV q12 h -Transfuse if repeat H/H after transfusion is below 7.0 or she remains symptomatic  2. HTN: She has a hx of HTN on a home dose of amlodipine 5 mg daily but her pressures are on the softer side here in the 120s SBP over 40s DBP. Given possibility for ongoing blood loss we will hold her home dose of amlodipine and cont to  monitor blood pressures. -hold home dose of amlodipine 5 mg -cont to monitor   FENGI: NPO, s/p 1 bolus NS in ED Code Status: DNR/DNI DVT prophylaxis: SCDs only given possibility of ongoing bleed  Signed: Synetta Shadow, Medical Student 04/07/2018, 4:51 PM   Attestation for Student Documentation:  I personally was present and performed or re-performed the history, physical exam and medical decision-making activities of this service and have verified that the service and findings are accurately documented in the student's note.  Emily Hardin D, DO 04/07/2018, 7:50 PM

## 2018-04-07 NOTE — Consult Note (Signed)
UNASSIGNED CONSULT  Reason for Consult: Symptomatic anemia Referring Physician: Teaching Service  Emily Hardin HPI: This is a 85 year old female with a PMH of chest pain/?NSTEMI 07/2017, EF of 55-60%, anemia, and HTN admitted for an acute onset of weakness.  Further evaluation of her blood work revealed an HGB of 5.9 g/dL.  As a result she was transferred to [facility] for further evaluation and treatment.  She denies having any issues with chest pain, SOB, or abdominal pain.  The patient does not report any history of melena or hematochezia and she denies having a prior EGD or colonoscopy.  There is a history of GERD, but it is not bothersome to her overall.  The patient denies any known family history of colon cancer.  Past Medical History:  Diagnosis Date  . Aspiration pneumonia ([place])   . Cancer ([place])   . Chest pain 08/14/2017  . Depression   . Encephalopathy   . HOH (hard of hearing)   . Hypertension   . Legally blind     Past Surgical History:  Procedure Laterality Date  . ABDOMINAL HYSTERECTOMY    . CESAREAN SECTION    . SHOULDER SURGERY      Family History  Problem Relation Age of Onset  . Obesity Daughter   . Heart attack Father     Social History:  reports that she quit smoking about 30 years ago. She has never used smokeless tobacco. She reports current alcohol use of about 1.0 standard drinks of alcohol per week. She reports that she does not use drugs.  Allergies: No Known Allergies  Medications:  Scheduled: . [START ON 04/08/2018] citalopram  20 mg Oral Daily  . pantoprazole (PROTONIX) IV  40 mg Intravenous Q12H  . sodium chloride flush  3 mL Intravenous Q12H   Continuous: . sodium chloride      Results for orders placed or performed during the hospital encounter of 04/07/18 (from the past 24 hour(s))  Type and screen [place]     Status: None (Preliminary result)   Collection Time: 04/07/18  1:43 PM  Result Value Ref Range   ABO/RH(D) A POS    Antibody Screen NEG    Sample Expiration 04/10/2018    Unit Number [id number]    Blood Component Type RED CELLS,LR    Unit division 00    Status of Unit ISSUED    Transfusion Status OK TO TRANSFUSE    Crossmatch Result      Compatible Performed at [facility], [address], [place], [place] [address]    Unit Number [id number]    Blood Component Type RED CELLS,LR    Unit division 00    Status of Unit ALLOCATED    Transfusion Status OK TO TRANSFUSE    Crossmatch Result Compatible   ABO/Rh     Status: None   Collection Time: 04/07/18  1:43 PM  Result Value Ref Range   ABO/RH(D)      A POS Performed at [facility], [place] [address], [place], [place] [address]   Basic metabolic panel     Status: Abnormal   Collection Time: 04/07/18  2:03 PM  Result Value Ref Range   Sodium 136 135 - 145 mmol/L   Potassium 4.5 3.5 - 5.1 mmol/L   Chloride 105 98 - 111 mmol/L   CO2 23 22 - 32 mmol/L   Glucose, Bld 125 (H) 70 - 99 mg/dL   BUN 50 (H)  8 - 23 mg/dL   Creatinine, Ser 0.90 0.44 - 1.00 mg/dL   Calcium 8.7 (L) 8.9 - 10.3 mg/dL   GFR calc non Af Amer 53 (L) >60 mL/min   GFR calc Af Amer >60 >60 mL/min   Anion gap 8 5 - 15  CBC     Status: Abnormal   Collection Time: 04/07/18  2:03 PM  Result Value Ref Range   WBC 13.0 (H) 4.0 - 10.5 K/uL   RBC 1.84 (L) 3.87 - 5.11 MIL/uL   Hemoglobin 5.8 (LL) 12.0 - 15.0 g/dL   HCT 18.9 (L) 36.0 - 46.0 %   MCV 102.7 (H) 80.0 - 100.0 fL   MCH 31.5 26.0 - 34.0 pg   MCHC 30.7 30.0 - 36.0 g/dL   RDW 13.6 11.5 - 15.5 %   Platelets 154 150 - 400 K/uL   nRBC 0.0 0.0 - 0.2 %  Protime-INR     Status: None   Collection Time: 04/07/18  2:03 PM  Result Value Ref Range   Prothrombin Time 13.8 11.4 - 15.2 seconds   INR 1.07   POC occult blood, ED     Status: Abnormal   Collection Time: 04/07/18  2:23 PM  Result Value Ref Range   Fecal Occult Bld POSITIVE (A) NEGATIVE  Prepare RBC     Status: None   Collection  Time: 04/07/18  3:05 PM  Result Value Ref Range   Order Confirmation      ORDER PROCESSED BY BLOOD BANK Performed at [facility], [place] [address], [place], [place] [address]      Dg Chest Portable 1 View  Result Date: 04/07/2018 CLINICAL DATA:  Uncontrolled blood pressure today EXAM: PORTABLE CHEST 1 VIEW COMPARISON:  Aug 13, 2017 FINDINGS: The mediastinal contour is normal. Heart size is enlarged. Mild patchy opacity right lung base is noted. There is no pulmonary edema or pleural effusion. The lungs are hyperinflated. The visualized skeletal structures are unremarkable. IMPRESSION: Mild patchy opacity right lung base identified, pneumonia is not excluded. Electronically Signed   By: Abelardo Diesel M.D.   On: 04/07/2018 14:51    ROS:  As stated above in the HPI otherwise negative.  Blood pressure (!) 130/47, pulse 79, temperature 98.9 F (37.2 C), temperature source Oral, resp. rate (!) 24, height 5\' 1"  (1.549 m), weight 68.2 kg, SpO2 98 %.    PE: Gen: NAD, Alert and Oriented HEENT:  [place]/AT, EOMI Neck: Supple, no LAD Lungs: CTA Bilaterally CV: RRR without M/G/R ABM: Soft, NTND, +BS Ext: No C/C/E  Assessment/Plan: 1) Symptomatic anemia. 2) Heme positive stool. 3) History of chest pain. 4) Anemia.   The patient has a baseline level of anemia in the 8-9 range and she denies any work up.  For her age she is in good shape, overall.  She states that if an EGD/colonoscopy need to be performed, she is willing to undergo the procedures for further evaluation.  With her age and the history of chest pain in the past, she will need to undergo cardiac clearance.  Plan: 1) Agree with the blood transfusions. 2) Cardiac clearance. 3) EGD/colonoscopy when she is cleared.  Zebedee Segundo D 04/07/2018, 5:53 PM

## 2018-04-07 NOTE — ED Triage Notes (Signed)
Pt arrives via [facility] from [facility] with reports of symptomatic anemia. Staff reports the pt fell this AM so they did blood work and Hgb was 5.5.

## 2018-04-07 NOTE — ED Provider Notes (Signed)
[facility] EMERGENCY DEPARTMENT Provider Note   CSN: [id number] Arrival date & time: 04/07/18  1325     History   Chief Complaint Chief Complaint  Patient presents with  . Anemia    HPI Emily Hardin is a 85 y.o. female.  HPI  85 year old female presents with weakness and anemia.  She has been feeling weak with less strength and difficulty standing since last night.  Labs were obtained today at her nursing facility and showed a hemoglobin of 5.5.  The patient denies any obvious source of bleeding, especially melena or hematochezia but she does not check her stools.  There is no chest pain or shortness of breath.  She is not on blood thinners.  She has had some abdominal cramping and bloating over the last 24 hours but none now.  Past Medical History:  Diagnosis Date  . Aspiration pneumonia ([place])   . Cancer ([place])   . Chest pain 08/14/2017  . Depression   . Encephalopathy   . HOH (hard of hearing)   . Hypertension   . Legally blind     Patient Active Problem List   Diagnosis Date Noted  . Symptomatic anemia 04/07/2018  . Chest pain 08/13/2017  . Hyponatremia 08/13/2017  . Essential hypertension 07/26/2017  . Primary insomnia 07/26/2017  . HTN (hypertension) 06/09/2017  . Depression 06/09/2017    Past Surgical History:  Procedure Laterality Date  . ABDOMINAL HYSTERECTOMY    . CESAREAN SECTION    . SHOULDER SURGERY       OB History   No obstetric history on file.      Home Medications    Prior to Admission medications   Medication Sig Start Date End Date Taking? Authorizing Provider  acetaminophen (TYLENOL) 325 MG tablet Take 650 mg by mouth See admin instructions. Take two tablets (650 MG) at bedtime, patient may also take two tablets every six hours as needed for pain or fever.    [provider]  amLODipine (NORVASC) 5 MG tablet Take 1 tablet (5 mg total) by mouth daily. 11/07/17 02/05/18  Revankar, Reita Cliche, MD  aspirin  EC 81 MG EC tablet Take 1 tablet (81 mg total) by mouth daily. 08/16/17   Mikhail, Velta Addison, DO  atenolol (TENORMIN) 25 MG tablet Take 12.5 mg by mouth at bedtime. Take 0.5 tablet (12.5 mg) in the am and Take 1 tablet (25 mg) in the pm.    [provider]  calcium carbonate (CAL-GEST ANTACID) 500 MG chewable tablet Chew 1 tablet by mouth daily.     [provider]  citalopram (CELEXA) 20 MG tablet Take 20 mg by mouth daily.     [provider]  lisinopril (PRINIVIL,ZESTRIL) 20 MG tablet Take 1 tablet (20 mg total) by mouth daily. 08/16/17   Mikhail, Velta Addison, DO  loratadine (CLARITIN) 10 MG tablet Take 10 mg by mouth at bedtime.    [provider]  Melatonin 3 MG TABS Take 3 mg by mouth at bedtime.    [provider]  Menthol, Topical Analgesic, (BIOFREEZE) 4 % GEL Apply 1 application topically at bedtime. To knees    [provider]  Multiple Vitamins-Iron (MULTIVITAMINS WITH IRON) TABS tablet Take 1 tablet by mouth daily.    [provider]  polyethylene glycol (MIRALAX / GLYCOLAX) packet Take 17 g by mouth daily.    [provider]  senna-docusate (SENEXON-S) 8.6-50 MG tablet Take 2 tablets by mouth at bedtime.    [provider]  vitamin A 8000 UNIT capsule Take 8,000 Units by mouth daily.    [provider]  zolpidem (AMBIEN) 5 MG tablet Take 2.5 mg by mouth at bedtime as needed for sleep.    [provider]    Family History Family History  Problem Relation Age of Onset  . Obesity Daughter   . Heart attack Father     Social History Social History   Tobacco Use  . Smoking status: Former Smoker    Last attempt to quit: 06/10/1987    Years since quitting: 30.8  . Smokeless tobacco: Never Used  Substance Use Topics  . Alcohol use: Yes    Alcohol/week: 1.0 standard drinks    Types: 1 Glasses of wine per week    Comment: 1 glass wine daily  . Drug use: Never     Allergies   Patient has  no known allergies.   Review of Systems Review of Systems  Constitutional: Positive for fatigue.  Respiratory: Negative for shortness of breath.   Cardiovascular: Negative for chest pain.  Gastrointestinal: Negative for blood in stool.  Neurological: Positive for weakness. Negative for dizziness, syncope, light-headedness and headaches.  All other systems reviewed and are negative.    Physical Exam Updated Vital Signs BP (!) 116/103   Pulse 77   Temp 98.9 F (37.2 C) (Oral)   Resp 16   SpO2 100%   Physical Exam Vitals signs and nursing note reviewed.  Constitutional:      General: She is not in acute distress.    Appearance: She is well-developed. She is not ill-appearing or diaphoretic.  HENT:     Head: Normocephalic and atraumatic.     Right Ear: External ear normal.     Left Ear: External ear normal.     Nose: Nose normal.  Eyes:     General:        Right eye: No discharge.        Left eye: No discharge.  Cardiovascular:     Rate and Rhythm: Normal rate and regular rhythm.     Heart sounds: Normal heart sounds.  Pulmonary:     Effort: Pulmonary effort is normal. No tachypnea or accessory muscle usage.     Breath sounds: Examination of the right-lower field reveals rales. Examination of the left-lower field reveals rales. Rales (mild) present.  Abdominal:     Palpations: Abdomen is soft.     Tenderness: There is no abdominal tenderness.  Genitourinary:    Comments: Dark stool on DRE.  Skin:    General: Skin is warm and dry.  Neurological:     Mental Status: She is alert.  Psychiatric:        Mood and Affect: Mood is not anxious.      ED Treatments / Results  Labs (all labs ordered are listed, but only abnormal results are displayed) Labs Reviewed  BASIC METABOLIC PANEL - Abnormal; Notable for the following components:      Result Value   Glucose, Bld 125 (*)    BUN 50 (*)    Calcium 8.7 (*)    GFR calc non Af Amer 53 (*)    All other components  within normal limits  CBC - Abnormal; Notable for the following components:   WBC 13.0 (*)    RBC 1.84 (*)    Hemoglobin 5.8 (*)    HCT 18.9 (*)    MCV 102.7 (*)    All other components within normal  limits  POC OCCULT BLOOD, ED - Abnormal; Notable for the following components:   Fecal Occult Bld POSITIVE (*)    All other components within normal limits  PROTIME-INR  URINALYSIS, ROUTINE W REFLEX MICROSCOPIC  CBC  TYPE AND SCREEN  PREPARE RBC (CROSSMATCH)  ABO/RH    EKG EKG Interpretation  Date/Time:  Tuesday April 07 2018 13:30:46 EST Ventricular Rate:  80 PR Interval:    QRS Duration: 77 QT Interval:  371 QTC Calculation: 428 R Axis:   48 Text Interpretation:  Sinus rhythm Prolonged PR interval nonspecific ST/T changes Confirmed by Sherwood Gambler [phone]) on 04/07/2018 2:02:49 PM   Radiology Dg Chest Portable 1 View  Result Date: 04/07/2018 CLINICAL DATA:  Uncontrolled blood pressure today EXAM: PORTABLE CHEST 1 VIEW COMPARISON:  Aug 13, 2017 FINDINGS: The mediastinal contour is normal. Heart size is enlarged. Mild patchy opacity right lung base is noted. There is no pulmonary edema or pleural effusion. The lungs are hyperinflated. The visualized skeletal structures are unremarkable. IMPRESSION: Mild patchy opacity right lung base identified, pneumonia is not excluded. Electronically Signed   By: Abelardo Diesel M.D.   On: 04/07/2018 14:51    Procedures .Critical Care Performed by: Sherwood Gambler, MD Authorized by: Sherwood Gambler, MD   Critical care provider statement:    Critical care time (minutes):  35   Critical care time was exclusive of:  Separately billable procedures and treating other patients   Critical care was necessary to treat or prevent imminent or life-threatening deterioration of the following conditions:  Circulatory failure   Critical care was time spent personally by me on the following activities:  Development of treatment plan with patient or  surrogate, discussions with consultants, evaluation of patient's response to treatment, obtaining history from patient or surrogate, examination of patient, ordering and performing treatments and interventions, ordering and review of laboratory studies, ordering and review of radiographic studies, pulse oximetry, re-evaluation of patient's condition and review of old charts   (including critical care time)  Medications Ordered in ED Medications  citalopram (CELEXA) tablet 20 mg (has no administration in time range)  sodium chloride flush (NS) 0.9 % injection 3 mL (has no administration in time range)  acetaminophen (TYLENOL) tablet 650 mg (has no administration in time range)    Or  acetaminophen (TYLENOL) suppository 650 mg (has no administration in time range)  pantoprazole (PROTONIX) injection 40 mg (has no administration in time range)  sodium chloride 0.9 % bolus 1,000 mL (has no administration in time range)     Initial Impression / Assessment and Plan / ED Course  I have reviewed the triage vital signs and the nursing notes.  Pertinent labs & imaging results that were available during my care of the patient were reviewed by me and considered in my medical decision making (see chart for details).     Patient appears to have symptomatic anemia.  While she is not hypotensive or showing signs of shock, she will need blood transfusion and work-up.  Rectal exam does show dark stool that is heme positive.  I have ordered 2 units of blood and she will need admission.  Final Clinical Impressions(s) / ED Diagnoses   Final diagnoses:  Symptomatic anemia    ED Discharge Orders    None       Sherwood Gambler, MD 04/07/18 1616

## 2018-04-08 DIAGNOSIS — D649 Anemia, unspecified: Secondary | ICD-10-CM

## 2018-04-08 DIAGNOSIS — I10 Essential (primary) hypertension: Secondary | ICD-10-CM

## 2018-04-08 DIAGNOSIS — Z01818 Encounter for other preprocedural examination: Secondary | ICD-10-CM

## 2018-04-08 DIAGNOSIS — Z9889 Other specified postprocedural states: Secondary | ICD-10-CM

## 2018-04-08 DIAGNOSIS — Z79899 Other long term (current) drug therapy: Secondary | ICD-10-CM

## 2018-04-08 DIAGNOSIS — D62 Acute posthemorrhagic anemia: Secondary | ICD-10-CM

## 2018-04-08 DIAGNOSIS — I252 Old myocardial infarction: Secondary | ICD-10-CM

## 2018-04-08 DIAGNOSIS — Z7982 Long term (current) use of aspirin: Secondary | ICD-10-CM

## 2018-04-08 DIAGNOSIS — Z66 Do not resuscitate: Secondary | ICD-10-CM

## 2018-04-08 LAB — CBC
HCT: 23.7 % — ABNORMAL LOW (ref 36.0–46.0)
HCT: 27.7 % — ABNORMAL LOW (ref 36.0–46.0)
Hemoglobin: 7.7 g/dL — ABNORMAL LOW (ref 12.0–15.0)
Hemoglobin: 8.9 g/dL — ABNORMAL LOW (ref 12.0–15.0)
MCH: 29.2 pg (ref 26.0–34.0)
MCH: 29.3 pg (ref 26.0–34.0)
MCHC: 32.5 g/dL (ref 30.0–36.0)
MCHC: 32.1 g/dL (ref 30.0–36.0)
MCV: 89.8 fL (ref 80.0–100.0)
MCV: 91.1 fL (ref 80.0–100.0)
Platelets: 136 10*3/uL — ABNORMAL LOW (ref 150–400)
Platelets: 137 10*3/uL — ABNORMAL LOW (ref 150–400)
RBC: 2.64 MIL/uL — ABNORMAL LOW (ref 3.87–5.11)
RBC: 3.04 MIL/uL — ABNORMAL LOW (ref 3.87–5.11)
RDW: 18 % — AB (ref 11.5–15.5)
RDW: 18.7 % — ABNORMAL HIGH (ref 11.5–15.5)
WBC: 8.1 10*3/uL (ref 4.0–10.5)
WBC: 8.3 10*3/uL (ref 4.0–10.5)
nRBC: 0 % (ref 0.0–0.2)
nRBC: 0 % (ref 0.0–0.2)

## 2018-04-08 LAB — BPAM RBC
BLOOD PRODUCT EXPIRATION DATE: 202002022359
Blood Product Expiration Date: 202001282359
ISSUE DATE / TIME: 202001211547
ISSUE DATE / TIME: 202001212104
UNIT TYPE AND RH: 6200
Unit Type and Rh: 6200

## 2018-04-08 LAB — TYPE AND SCREEN
ABO/RH(D): A POS
Antibody Screen: NEGATIVE
UNIT DIVISION: 0
UNIT DIVISION: 0

## 2018-04-08 LAB — BASIC METABOLIC PANEL
Anion gap: 5 (ref 5–15)
BUN: 33 mg/dL — ABNORMAL HIGH (ref 8–23)
CO2: 24 mmol/L (ref 22–32)
CREATININE: 0.87 mg/dL (ref 0.44–1.00)
Calcium: 8.3 mg/dL — ABNORMAL LOW (ref 8.9–10.3)
Chloride: 108 mmol/L (ref 98–111)
GFR calc Af Amer: >60 mL/min (ref >60)
GFR calc non Af Amer: 55 mL/min — ABNORMAL LOW (ref >60)
Glucose, Bld: 90 mg/dL (ref 70–99)
Potassium: 3.9 mmol/L (ref 3.5–5.1)
Sodium: 137 mmol/L (ref 135–145)

## 2018-04-08 NOTE — Evaluation (Signed)
Occupational Therapy Evaluation Patient Details Name: Emily Hardin MRN: [id number] DOB: 1917/01/21 Today's Date: 04/08/2018    History of Present Illness 85 year old female with a PMH of chest pain/?NSTEMI 07/2017, EF of 55-60%, anemia, and HTN admitted for an acute onset of weakness.  Further evaluation of her blood work revealed an HGB of 5.9 g/dL.  As a result she was transferred to [facility] for further evaluation and treatment.     Clinical Impression   Pt admitted with the above diagnoses and presents with below problem list. Pt will benefit from continued acute OT to address the below listed deficits and maximize independence with basic ADLs prior to d/c below. At baseline, pt is independent with ADLs and lives in [place]. Pt min guard to light min A with LB ADLs, toilet and shower transfers. Recommend d/c back to SNF to finish short-term rehab prior to returning home alone. Granddaughter present during OT eval.      Follow Up Recommendations  SNF    Equipment Recommendations  Other (comment)(defer to next venue)    Recommendations for Other Services PT consult     Precautions / Restrictions Precautions Precautions: Fall Precaution Comments: HOH, legally blind (macular degeneration) Restrictions Weight Bearing Restrictions: No      Mobility Bed Mobility Overal bed mobility: Needs Assistance Bed Mobility: Sit to Supine       Sit to supine: Supervision   General bed mobility comments: pt sat up to EOB with MD while OT had steddped out of room. Supervision to return to supine  Transfers Overall transfer level: Needs assistance Equipment used: Rolling walker (2 wheeled) Transfers: Sit to/from [facility] Sit to Stand: Min guard Stand pivot transfers: Supervision       General transfer comment: from EOB to [facility]. min guard to steady during sit<>stand.    Balance Overall balance assessment: Needs assistance Sitting-balance support: Feet  supported;No upper extremity supported Sitting balance-Leahy Scale: Good     Standing balance support: Bilateral upper extremity supported;During functional activity Standing balance-Leahy Scale: Poor Standing balance comment: external support at baseline (rollator)                           ADL either performed or assessed with clinical judgement   ADL Overall ADL's : Needs assistance/impaired Eating/Feeding: Set up;Sitting Eating/Feeding Details (indicate cue type and reason): MD change her to clear liquid diet during session Grooming: Set up;Sitting   Upper Body Bathing: Set up;Sitting   Lower Body Bathing: Min guard;Minimal assistance;Sit to/from stand   Upper Body Dressing : Set up;Sitting   Lower Body Dressing: Min guard;Sit to/from stand   Toilet Transfer: Min guard;Stand-pivot;RW   Toileting- Water quality scientist and Hygiene: Min guard;Sit to/from stand   Tub/ Shower Transfer: Min guard;Rolling walker;Stand-pivot     General ADL Comments: Pt completed bed mobility, sat EOB several minutes then SPT to/from BSC.     Vision Baseline Vision/History: Legally blind;Macular Degeneration       Perception     Praxis      Pertinent Vitals/Pain Pain Assessment: Faces Faces Pain Scale: No hurt     Hand Dominance     Extremity/Trunk Assessment Upper Extremity Assessment Upper Extremity Assessment: Generalized weakness;Overall [facility] for tasks assessed   Lower Extremity Assessment Lower Extremity Assessment: Defer to PT evaluation   Cervical / Trunk Assessment Cervical / Trunk Assessment: Kyphotic   Communication Communication Communication: HOH   Cognition Arousal/Alertness: Awake/alert Behavior During Therapy: [facility]  for tasks assessed/performed Overall Cognitive Status: Within Functional Limits for tasks assessed                                 General Comments: some STM deficits noted, likely baseline   General Comments   Grandaughter present throughout eval.    Exercises Exercises: Other exercises Other Exercises Other Exercises: setup theraband on upper bed rails and instructed in general UB strengthening exercises to prevent deconditioning.    Shoulder Instructions      Home Living Family/patient expects to be discharged to:: Assisted living                             Home Equipment: Walker - 4 wheels;Cane - single point   Additional Comments: from ILF at baseline, was in short-term rehab at [facility]      Prior Functioning/Environment Level of Independence: Independent                 OT Problem List: Impaired balance (sitting and/or standing);Decreased knowledge of use of DME or AE;Decreased knowledge of precautions      OT Treatment/Interventions: Self-care/ADL training;DME and/or AE instruction;Therapeutic activities;Patient/family education;Balance training    OT Goals(Current goals can be found in the care plan section) Acute Rehab OT Goals Patient Stated Goal: eager to return home, appears in agreement with finishing rehab at SNF before returning home OT Goal Formulation: With patient/family Time For Goal Achievement: 04/22/18 Potential to Achieve Goals: Good ADL Goals Pt Will Perform Grooming: with modified independence;standing Pt Will Perform Lower Body Bathing: with modified independence;sit to/from stand Pt Will Perform Lower Body Dressing: with modified independence;sit to/from stand Pt Will Transfer to Toilet: with modified independence;ambulating Pt Will Perform Toileting - Clothing Manipulation and hygiene: with modified independence;sit to/from stand Pt Will Perform Tub/Shower Transfer: with modified independence;ambulating;rolling walker;shower seat  OT Frequency: Min 2X/week   Barriers to D/C:            Co-evaluation              AM-PAC OT "6 Clicks" Daily Activity     Outcome Measure Help from another person eating meals?: None Help from  another person taking care of personal grooming?: None Help from another person toileting, which includes using toliet, bedpan, or urinal?: A Little Help from another person bathing (including washing, rinsing, drying)?: A Little Help from another person to put on and taking off regular upper body clothing?: None Help from another person to put on and taking off regular lower body clothing?: A Little 6 Click Score: 21   End of Session Equipment Utilized During Treatment: Rolling walker Nurse Communication: Other (comment)(MD changed diet to clear liquids during OT session)  Activity Tolerance: Patient tolerated treatment well Patient left: in bed;with call bell/phone within reach;with bed alarm set;with family/visitor present  OT Visit Diagnosis: Unsteadiness on feet (R26.81);Muscle weakness (generalized) (M62.81)                Time: 6433-2951 OT Time Calculation (min): 25 min Charges:  OT General Charges $OT Visit: 1 Visit OT Evaluation $OT Eval Low Complexity: 1 Low OT Treatments $Therapeutic Exercise: 8-22 mins  Tyrone Schimke, OT Acute Rehabilitation Services Pager: [phone] Office: [phone]   Hortencia Pilar 04/08/2018, 11:32 AM

## 2018-04-08 NOTE — Consult Note (Addendum)
Cardiology Consult    Patient ID: Emily Hardin MRN: [id number], DOB/AGE: Dec 06, 1916   Admit date: 04/07/2018 Date of Consult: 04/08/2018  Primary Physician: Emily Clamp, MD Primary Cardiologist: Emily Lindau, MD Requesting Provider: Dr. Benson [facility]  Patient Profile    Emily Hardin is a 85 y.o. female with a history of CAD s/p NSTEMI in 07/2017 which was treated with medical therapy, hypertension, chronic normocytic anemia, who is being seen today for a pre-operative evaluation at the request of Dr. Benson Hardin.   History of Present Illness    [name] is a 85 year old female with a history of CAD s/p NSTEMI in 07/2017 which was treated with medical therapy, hypertension, and chronic normocytic anemia. Patient admitted from 08/13/2017 to 08/15/2017 after presenting with chest pain and was found to have a NSTEMI. Troponin peaked at 6.8. Patient did not want any invasive procedure at the time and opted for medical therapy. Echocardiogram at that time showed LVEF of 55-60% with no regional wall motion abnormalities. She was hesitant to start Plavix due to her age and increased risk of bleeding so patient was treated with Heparin for 48 hours.  Patent presented to [facility] yesterday after suffering fall at her skilled nursing facility. Labs were obtained at this facility and showed a hemoglobin of 5.5 so patient was transported to the [facility] for further evaluation and management of her acute symptomatic anemia. GI was consulted and recommended an EGD. Cardiology was consulted for pre-operative evaluation.  Patient denies any chest pain, shortness of breath, or palpitations. Patient does some stable orthopnea and state she sleeps on an incline because she has trouble breathing when she lays flat. She denies any PND. Patient felt dizzy prior to her fall but does not normally have any lightheadedness or dizziness. Patient was recently admitted for RSV pneumonia and has been in a rehab  facility recovering.   Past Medical History   Past Medical History:  Diagnosis Date  . Aspiration pneumonia ([place])   . Cancer ([place])   . Chest pain 08/14/2017  . Depression   . Encephalopathy   . HOH (hard of hearing)   . Hypertension   . Legally blind     Past Surgical History:  Procedure Laterality Date  . ABDOMINAL HYSTERECTOMY    . CESAREAN SECTION    . SHOULDER SURGERY       Allergies  No Known Allergies  Inpatient Medications    . citalopram  20 mg Oral Daily  . Melatonin  3 mg Oral QHS  . pantoprazole (PROTONIX) IV  40 mg Intravenous Q12H  . sodium chloride flush  3 mL Intravenous Q12H    Family History    Family History  Problem Relation Age of Onset  . Obesity Daughter   . Heart attack Father    She indicated that her mother is deceased. She indicated that her father is deceased. She indicated that the status of her daughter is unknown.   Social History    Social History   Socioeconomic History  . Marital status: Widowed    Spouse name: Not on file  . Number of children: Not on file  . Years of education: Not on file  . Highest education level: Not on file  Occupational History  . Not on file  Social Needs  . Financial resource strain: Not on file  . Food insecurity:    Worry: Not on file    Inability: Not on file  . Transportation needs:  Medical: Not on file    Non-medical: Not on file  Tobacco Use  . Smoking status: Former Smoker    Last attempt to quit: 06/10/1987    Years since quitting: 30.8  . Smokeless tobacco: Never Used  Substance and Sexual Activity  . Alcohol use: Yes    Alcohol/week: 1.0 standard drinks    Types: 1 Glasses of wine per week    Comment: 1 glass wine daily  . Drug use: Never  . Sexual activity: Not on file  Lifestyle  . Physical activity:    Days per week: Not on file    Minutes per session: Not on file  . Stress: Not on file  Relationships  . Social connections:    Talks on phone: Not on file     Gets together: Not on file    Attends religious service: Not on file    Active member of club or organization: Not on file    Attends meetings of clubs or organizations: Not on file    Relationship status: Not on file  . Intimate partner violence:    Fear of current or ex partner: Not on file    Emotionally abused: Not on file    Physically abused: Not on file    Forced sexual activity: Not on file  Other Topics Concern  . Not on file  Social History Narrative  . Not on file     Review of Systems    Review of Systems  Constitutional: Negative for chills and fever.  HENT: Positive for congestion (with recent pneumonia).   Respiratory: Positive for cough (with recent pneumonia). Negative for shortness of breath.   Cardiovascular: Positive for orthopnea. Negative for chest pain, palpitations and PND.  Gastrointestinal: Negative for abdominal pain, blood in stool and melena.  Genitourinary: Negative for hematuria.  Musculoskeletal: Positive for falls.  Neurological: Positive for dizziness.  Endo/Heme/Allergies: Bruises/bleeds easily (easy bruising).  All other systems reviewed and are negative.   Physical Exam    Blood pressure (!) 114/49, pulse 72, temperature 98.8 F (37.1 C), temperature source Oral, resp. rate 18, height 5\' 1"  (1.549 m), weight 65.7 kg, SpO2 95 %.  General: 85 y.o. female resting comfortably in no acute distress. Pleasant and cooperative. HEENT: Normal  Neck: Supple. No carotid bruits. JVD elevated. Lungs: No increased work of breathing. Lung relatively clear to ausculation. Minimal crackles noted in bilateral bases.  Heart: RRR. Distinct S1 and S2. II-II/VI systolic murmur. No gallops or rubs.  Abdomen: Soft, non-distended, and non-tender to palpation. Bowel sounds present. Extremities: Tr  lower extremity edema. Radial pulses 2+ and equal bilaterally. Neuro: Alert and oriented x3. No focal deficits. Moves all extremities spontaneously. Skin: Warm and  dry. Psych: Normal affect.  Labs    Troponin (Point of Care Test) No results for input(s): TROPIPOC in the last 72 hours. No results for input(s): CKTOTAL, CKMB, TROPONINI in the last 72 hours. Lab Results  Component Value Date   WBC 8.1 04/08/2018   HGB 7.7 (L) 04/08/2018   HCT 23.7 (L) 04/08/2018   MCV 89.8 04/08/2018   PLT 136 (L) 04/08/2018    Recent Labs  Lab 04/08/18 0246  NA 137  K 3.9  CL 108  CO2 24  BUN 33*  CREATININE 0.87  CALCIUM 8.3*  GLUCOSE 90   Lab Results  Component Value Date   CHOL 159 08/15/2017   HDL 62 08/15/2017   LDLCALC 87 08/15/2017   TRIG 50 08/15/2017  No results found for: [facility]   Radiology Studies    Dg Chest Portable 1 View  Result Date: 04/07/2018 CLINICAL DATA:  Uncontrolled blood pressure today EXAM: PORTABLE CHEST 1 VIEW COMPARISON:  Aug 13, 2017 FINDINGS: The mediastinal contour is normal. Heart size is enlarged. Mild patchy opacity right lung base is noted. There is no pulmonary edema or pleural effusion. The lungs are hyperinflated. The visualized skeletal structures are unremarkable. IMPRESSION: Mild patchy opacity right lung base identified, pneumonia is not excluded. Electronically Signed   By: Abelardo Diesel M.D.   On: 04/07/2018 14:51    EKG     EKG: EKG was personally reviewed and demonstrates: Sinus rhythm with 1st degree AV block but no acute ischemic changes compared to prior tracings.    Cardiac Imaging    Echocardiogram 08/14/2017: Study Conclusions: - Left ventricle: The cavity size was normal. Systolic function was   normal. The estimated ejection fraction was in the range of 55%   to 60%. Wall motion was normal; there were no regional wall   motion abnormalities. - Aortic valve: Mildly calcified annulus. Trileaflet; normal   thickness, mildly calcified leaflets. There was mild   regurgitation. - Mitral valve: There was moderate regurgitation. - Left atrium: The atrium was moderately to severely  dilated.  Assessment & Plan    Pre-Operative Evaluation - Patient presented after a fall and hemoglobin found to be 5.5. Patient received 2 units of PRBCs and most recent hemoglobin 7.7. GI was consulted and recommended EGD. Cardiology was consulted for pre-operative evaluation given patient's age and recent NSTEMI in 07/2017. - Echo from 07/2017 showed LVEF of 55-60%. - Patient denies any chest pain or shortness of breath. She reports some stable orthopnea but no PND. - EGD is a low risk procedure but given patient advanced age and cardiac history, patient is a higher risk patient for any complications   Not prohibitive however.   Profound anemia is a higher risk  She got through that without any signs of active cardiac ischemia.  Signed, Darreld Mclean, PA-C 04/08/2018, 3:55 PM  Patient seen and examined   I agree with findings as noted above by C Sarajane Jews Pt is a 85 yo with probable CAD   Had NSTEMI in May 2019   No CP at time   Refused work up  Admitted after fall   Found to be profoundly anemic   Transfused 2 U PRBC Pt deneis CP   No SOB   On exam:  Tele:   SR  NEck:  JVP is increased Lungs are CTA   Cardiac RRR  Gr I/VI systolic m apex Ext with trivial edema  I think it is OK to go ahead with EGD without further cardiac testing    Watch BP  Watch fluids  Dorris Carnes  For questions or updates, please contact   Please consult [email or web address] for contact info under Cardiology/STEMI.

## 2018-04-08 NOTE — Evaluation (Signed)
Physical Therapy Evaluation Patient Details Name: Emily Hardin MRN: [id number] DOB: Mar 04, 1917 Today's Date: 04/08/2018   History of Present Illness  Pt is a 85 y.o. female admitted 04/07/18 with acute onset of weakness; blood work revealed Hgb 5.9 g/dL. Awaiting cardiac clearance for potential EGD/colonoscopy. PMH includes NSTEMI, anemia, HTN, legally blind (macular degeneration), HOH.    Clinical Impression  Pt presents with an overall decrease in functional mobility secondary to above. PTA, pt was about to finish rehab at [facility] and return home to ILF, but then admitted to hospital. At baseline, ambulatory with rollator. Today, pt able to transfer and amb short distance with RW and min guard for balance; limited by generalized weakness, fatigue and decreased activity tolerance. Pt would benefit from continued acute PT services to maximize functional mobility and independence prior to d/c with continued SNF-level therapies; pt in agreement.     Follow Up Recommendations SNF;Supervision for mobility/OOB    Equipment Recommendations  None recommended by PT    Recommendations for Other Services       Precautions / Restrictions Precautions Precautions: Fall Precaution Comments: HOH, legally blind (macular degeneration) Restrictions Weight Bearing Restrictions: No      Mobility  Bed Mobility Overal bed mobility: Needs Assistance Bed Mobility: Supine to Sit;Sit to Supine     Supine to sit: Supervision;HOB elevated Sit to supine: Supervision   General bed mobility comments: Increased time and effort; no physical assist required  Transfers Overall transfer level: Needs assistance Equipment used: Rolling walker (2 wheeled) Transfers: Sit to/from Stand Sit to Stand: Min guard Stand pivot transfers: Supervision       General transfer comment: Good technique for set-up to stand; min guard for safety, heavy reliance on UE support to push into  standing  Ambulation/Gait Ambulation/Gait assistance: Min guard Gait Distance (Feet): 30 Feet Assistive device: Rolling walker (2 wheeled) Gait Pattern/deviations: Step-through pattern;Decreased stride length;Trunk flexed Gait velocity: Decreased Gait velocity interpretation: <1.8 ft/sec, indicate of risk for recurrent falls General Gait Details: Slow, labored gait with RW and min guard for balance; further distance limited secondary to fatigue  Stairs            Wheelchair Mobility    Modified Rankin (Stroke Patients Only)       Balance Overall balance assessment: Needs assistance Sitting-balance support: Feet supported;No upper extremity supported Sitting balance-Leahy Scale: Good     Standing balance support: Bilateral upper extremity supported;During functional activity Standing balance-Leahy Scale: Poor Standing balance comment: Reliant on UE support                             Pertinent Vitals/Pain Pain Assessment: No/denies pain Faces Pain Scale: No hurt    Home Living Family/patient expects to be discharged to:: Assisted living               Home Equipment: Walker - 4 wheels;Cane - single point Additional Comments: Resident at [place] at baseline; was in short-term rehab at [facility], was just about to d/c back to ILF from SNF day of admission    Prior Function Level of Independence: Independent with assistive device(s)         Comments: Mod indep with rollator     Hand Dominance        Extremity/Trunk Assessment   Upper Extremity Assessment Upper Extremity Assessment: Generalized weakness    Lower Extremity Assessment Lower Extremity Assessment: Generalized weakness    Cervical / Trunk Assessment Cervical /  Trunk Assessment: Kyphotic  Communication   Communication: HOH  Cognition Arousal/Alertness: Awake/alert Behavior During Therapy: WFL for tasks assessed/performed Overall Cognitive Status: Within Functional Limits for  tasks assessed                                 General Comments: some STM deficits noted, likely baseline      General Comments General comments (skin integrity, edema, etc.): Grandaughter present and supportive    Exercises Other Exercises Other Exercises: setup theraband on upper bed rails and instructed in general UB strengthening exercises to prevent deconditioning.    Assessment/Plan    PT Assessment Patient needs continued PT services  PT Problem List Decreased strength;Decreased activity tolerance;Decreased balance;Decreased mobility       PT Treatment Interventions DME instruction;Gait training;Stair training;Functional mobility training;Therapeutic activities;Therapeutic exercise;Balance training;Patient/family education    PT Goals (Current goals can be found in the Care Plan section)  Acute Rehab PT Goals Patient Stated Goal: eager to return home, but agrees she needs continued rehab at [facility] before returning to ILF PT Goal Formulation: With patient/family Time For Goal Achievement: 04/22/18 Potential to Achieve Goals: Good    Frequency Min 2X/week   Barriers to discharge        Co-evaluation               AM-PAC PT "6 Clicks" Mobility  Outcome Measure Help needed turning from your back to your side while in a flat bed without using bedrails?: A Little Help needed moving from lying on your back to sitting on the side of a flat bed without using bedrails?: A Little Help needed moving to and from a bed to a chair (including a wheelchair)?: A Little Help needed standing up from a chair using your arms (e.g., wheelchair or bedside chair)?: A Little Help needed to walk in hospital room?: A Little Help needed climbing 3-5 steps with a railing? : A Lot 6 Click Score: 17    End of Session Equipment Utilized During Treatment: Gait belt Activity Tolerance: Patient tolerated treatment well;Patient limited by fatigue Patient left: in bed;with call  bell/phone within reach;with family/visitor present;with bed alarm set Nurse Communication: Mobility status PT Visit Diagnosis: Other abnormalities of gait and mobility (R26.89);Muscle weakness (generalized) (M62.81)    Time: 4166-0630 PT Time Calculation (min) (ACUTE ONLY): 15 min   Charges:   PT Evaluation $PT Eval Moderate Complexity: 1 Mod        Mabeline Caras, PT, DPT Acute Rehabilitation Services  Pager [phone] Office [place] 04/08/2018, 12:05 PM

## 2018-04-08 NOTE — Clinical Social Work Note (Signed)
Per RN in morning progression meeting, patient was admitted from [facility]. CSW spoke with admissions coordinator who stated patient is a short-term rehab resident. She has used 13 SNF days and was scheduled to discharge home today. She is unsure if this hospitalization will change things but said they will take her back if PT recommends it. PT and OT evaluations are pending. Patient was living alone at [facility] ILF prior to [facility].  [place] Scrape, [place]

## 2018-04-08 NOTE — Plan of Care (Signed)
  Problem: Education: Goal: Knowledge of General Education information will improve Description: Including pain rating scale, medication(s)/side effects and non-pharmacologic comfort measures Outcome: Progressing   Problem: Clinical Measurements: Goal: Ability to maintain clinical measurements within normal limits will improve Outcome: Progressing Goal: Will remain free from infection Outcome: Progressing   

## 2018-04-08 NOTE — NC FL2 (Signed)
[place] MEDICAID FL2 LEVEL OF CARE SCREENING TOOL     IDENTIFICATION  Patient Name: Emily Hardin Birthdate: February 05, 1917 Sex: female Admission Date (Current Location): 04/07/2018  [facility] and [place] Number:  Herbalist and Address:  [facility] [place], [place] [address], [place], [place] [address]      Provider Number: [id number]  Attending Physician Name and Address:  Lucious Groves, DO  Relative Name and Phone Number:       Current Level of Care: Hospital Recommended Level of Care: [place] Prior Approval Number:    Date Approved/Denied:   PASRR Number: [id number]  Discharge Plan: SNF    Current Diagnoses: Patient Active Problem List   Diagnosis Date Noted  . Symptomatic anemia 04/07/2018  . Chest pain 08/13/2017  . Hyponatremia 08/13/2017  . Essential hypertension 07/26/2017  . Primary insomnia 07/26/2017  . HTN (hypertension) 06/09/2017  . Depression 06/09/2017    Orientation RESPIRATION BLADDER Height & Weight     Self, Time, Situation, Place  Normal Continent Weight: 144 lb 12.8 oz (65.7 kg)(scale a) Height:  5\' 1"  (154.9 cm)  BEHAVIORAL SYMPTOMS/MOOD NEUROLOGICAL BOWEL NUTRITION STATUS  (None) (None) Continent Diet(See discharge summary when ready. Currently on clear liquids.)  AMBULATORY STATUS COMMUNICATION OF NEEDS Skin   Limited Assist Verbally Normal                       Personal Care Assistance Level of Assistance  Bathing, Feeding, Dressing Bathing Assistance: Limited assistance Feeding assistance: Limited assistance Dressing Assistance: Limited assistance     Functional Limitations Info  Sight, Hearing, Speech Sight Info: Adequate Hearing Info: Adequate Speech Info: Adequate    SPECIAL CARE FACTORS FREQUENCY  PT (By licensed PT), OT (By licensed OT)     PT Frequency: 5 x week OT Frequency: 5 x week            Contractures Contractures Info: Not present    Additional  Factors Info  Code Status, Allergies, Psychotropic Code Status Info: DNR Allergies Info: NKDA Psychotropic Info: Depression: Celexa 20 mg PO daily.         Current Medications (04/08/2018):  This is the current hospital active medication list Current Facility-Administered Medications  Medication Dose Route Frequency Provider Last Rate Last Dose  . acetaminophen (TYLENOL) tablet 650 mg  650 mg Oral Q6H PRN Santos-Sanchez, Merlene Morse, MD       Or  . acetaminophen (TYLENOL) suppository 650 mg  650 mg Rectal Q6H PRN Santos-Sanchez, Idalys, MD      . acetaminophen (TYLENOL) tablet 650 mg  650 mg Oral QHS PRN Isabelle Course, MD   650 mg at 04/07/18 2200  . citalopram (CELEXA) tablet 20 mg  20 mg Oral Daily Welford Roche, MD   20 mg at 04/08/18 0959  . Melatonin TABS 3 mg  3 mg Oral QHS Isabelle Course, MD   3 mg at 04/07/18 2200  . pantoprazole (PROTONIX) injection 40 mg  40 mg Intravenous Q12H Welford Roche, MD   40 mg at 04/08/18 0959  . sodium chloride 0.9 % bolus 1,000 mL  1,000 mL Intravenous Once Santos-Sanchez, Idalys, MD      . sodium chloride flush (NS) 0.9 % injection 3 mL  3 mL Intravenous Q12H Santos-Sanchez, Idalys, MD   3 mL at 04/08/18 1201  . zolpidem (AMBIEN) tablet 2.5 mg  2.5 mg Oral QHS PRN Isabelle Course, MD   2.5 mg at  04/07/18 2200     Discharge Medications: Please see discharge summary for a list of discharge medications.  Relevant Imaging Results:  Relevant Lab Results:   Additional Information SS#: [id number]  Candie Chroman, LCSW

## 2018-04-08 NOTE — Progress Notes (Signed)
  Subjective: She is doing well this morning, she slept through the night and is feeling better. No further light headedness with standing. Denies chest pain or shortness of breath. Upon further discussion, she would like to proceed with EGD but would prefer not to have colonoscopy done due to prep beforehand.    Objective: Vital signs in last 24 hours: Vitals:   04/07/18 2049 04/07/18 2125 04/08/18 0021 04/08/18 0436  BP: (!) 136/42 (!) 122/44 (!) 116/40 (!) 130/50  Pulse: 75 72 71 72  Resp: 18 18 16 18   Temp: 98.5 F (36.9 C) 98.5 F (36.9 C) 98.1 F (36.7 C) 98.7 F (37.1 C)  TempSrc: Oral Oral Oral Oral  SpO2: 95% 98% 94% 94%  Weight:    65.7 kg  Height:        General appearance: alert, cooperative and no distress Lungs: coarse breath sounds in lower lung bases, no increased work of breathing Heart: regular rate and rhythm, S1, S2 normal, no murmur, click, rub or gallop Abdomen: soft, non-tender to palpation Extremities: no swelling in extremities, TTP 2/2 to neuropathy  Lab Results: CBC: Hgb 7.7, Hct 23.7, MCV 89.8, platelets 136 BMP: BUN 33  Assessment/Plan: Active Problems:   Symptomatic anemia   1. Symptomatic Anemia: HgB today was 7.7 and BUN down to 33 with 2 units transfused. She is feeling much better today s/p transfusion. GI recommends an EGD/colonscopy, but patient would prefer to only proceed with EGD. GI would like cardiac clearance prior to the procedure with her hx of an NSTEMI in May of last year. Despite her cardiac hx we are still only transfusing to goal of 7 given the risk of fluid overload in pts her age.  -Following GI recommendations -Regular Liquid diet -Protonix 40 Mg IV q12 h -Transfuse if repeat H/H after transfusion is below 7.0 or she remains symptomatic  2. HTN: Blood pressures 130/50, will continue to hold BP medication to avoid further orthostasis or hypotensive episodes.  -hold home dose of amlodipine 5 mg -cont to monitor  3. Hx  of NSTEMI: Per chart review, she had an NSTEMI in May of 2019 with troponin's elevated to peak of 6.5. She was treated with heparin and decided ultimately to not start plavix. She continues to take baby aspirin but this has been held in the hospital. Cardiology consulting for cardiac clearance prior to EGD procedure. Appreciate their recommendations.    FENGI: Reg liquids diet Code Status: DNR/DNI DVT prophylaxis: SCDs only given possibility of ongoing bleed    LOS: 1 day   Synetta Shadow, Medical Student 04/08/2018, 8:04 AM  Attestation for Student Documentation:  I personally was present and performed or re-performed the history, physical exam and medical decision-making activities of this service and have verified that the service and findings are accurately documented in the student's note.  Modena Nunnery D, DO 04/08/2018, 3:31 PM

## 2018-04-08 NOTE — Clinical Social Work Note (Signed)
Clinical Social Work Assessment  Patient Details  Name: Emily Hardin MRN: [id number] Date of Birth: [birth year]  Date of referral:  04/08/18               Reason for consult:  Discharge Planning                Permission sought to share information with:  Chartered certified accountant granted to share information::  Yes, Verbal Permission Granted  Name::        Agency::  [facility]  Relationship::     Contact Information:     Housing/Transportation Living arrangements for the past 2 months:  [place], Charity fundraiser of Information:  Patient, Scientist, water quality, Facility Patient Interpreter Needed:  None Criminal Activity/Legal Involvement Pertinent to Current Situation/Hospitalization:  No - Comment as needed Significant Relationships:  Adult Children, Other Family Members Lives with:  Self Do you feel safe going back to the place where you live?  Yes Need for family participation in patient care:  Yes (Comment)  Care giving concerns:  Patient is a short-term rehab resident from [facility] PT recommending continued rehab prior to return home at [facility].   Social Worker assessment / plan:  CSW met with patient. No supports at bedside. CSW introduced role and explained that PT recommendations would be discussed. Patient confirmed she was getting rehab at [facility] prior to admission and is willing to return there for continued rehab prior to going home. Admissions coordinator is aware. No further concerns. CSW encouraged patient to contact CSW as needed. CSW will continue to follow patient for support and facilitate discharge back to SNF once medically stable.  Employment status:  Retired Forensic scientist:  Medicare PT Recommendations:  [place] / Referral to community resources:  [place]  Patient/Family's Response to care:  Patient agreeable to return to SNF.  Patient's family supportive and involved in patient's care. Patient appreciated social work intervention.  Patient/Family's Understanding of and Emotional Response to Diagnosis, Current Treatment, and Prognosis:  Patient has a good understanding of the reason for admission and her need for continued rehab prior to returning home. Patient appears happy with hospital care.  Emotional Assessment Appearance:  Appears stated age Attitude/Demeanor/Rapport:  Engaged, Gracious Affect (typically observed):  Accepting, Appropriate, Calm, Pleasant Orientation:  Oriented to Self, Oriented to Place, Oriented to  Time, Oriented to Situation Alcohol / Substance use:  Never Used Psych involvement (Current and /or in the community):  No (Comment)  Discharge Needs  Concerns to be addressed:  Care Coordination Readmission within the last 30 days:  Yes Current discharge risk:  Dependent with Mobility, Lives alone Barriers to Discharge:  Continued Medical Work up   Candie Chroman, LCSW 04/08/2018, 12:56 PM

## 2018-04-09 DIAGNOSIS — R011 Cardiac murmur, unspecified: Secondary | ICD-10-CM

## 2018-04-09 LAB — CBC
HCT: 23.8 % — ABNORMAL LOW (ref 36.0–46.0)
HEMATOCRIT: 30.8 % — AB (ref 36.0–46.0)
HEMOGLOBIN: 7.8 g/dL — AB (ref 12.0–15.0)
Hemoglobin: 9.7 g/dL — ABNORMAL LOW (ref 12.0–15.0)
MCH: 29.7 pg (ref 26.0–34.0)
MCH: 28.9 pg (ref 26.0–34.0)
MCHC: 32.8 g/dL (ref 30.0–36.0)
MCHC: 31.5 g/dL (ref 30.0–36.0)
MCV: 90.5 fL (ref 80.0–100.0)
MCV: 91.7 fL (ref 80.0–100.0)
Platelets: 131 10*3/uL — ABNORMAL LOW (ref 150–400)
Platelets: 154 10*3/uL (ref 150–400)
RBC: 2.63 MIL/uL — ABNORMAL LOW (ref 3.87–5.11)
RBC: 3.36 MIL/uL — ABNORMAL LOW (ref 3.87–5.11)
RDW: 18.2 % — ABNORMAL HIGH (ref 11.5–15.5)
RDW: 17.8 % — ABNORMAL HIGH (ref 11.5–15.5)
WBC: 5.9 10*3/uL (ref 4.0–10.5)
WBC: 7.1 10*3/uL (ref 4.0–10.5)
nRBC: 0 % (ref 0.0–0.2)
nRBC: 0 % (ref 0.0–0.2)

## 2018-04-09 LAB — BASIC METABOLIC PANEL
Anion gap: 7 (ref 5–15)
BUN: 15 mg/dL (ref 8–23)
CHLORIDE: 106 mmol/L (ref 98–111)
CO2: 23 mmol/L (ref 22–32)
Calcium: 8.1 mg/dL — ABNORMAL LOW (ref 8.9–10.3)
Creatinine, Ser: 0.71 mg/dL (ref 0.44–1.00)
GFR calc Af Amer: >60 mL/min (ref >60)
GFR calc non Af Amer: >60 mL/min (ref >60)
Glucose, Bld: 93 mg/dL (ref 70–99)
Potassium: 3.8 mmol/L (ref 3.5–5.1)
Sodium: 136 mmol/L (ref 135–145)

## 2018-04-09 MED ORDER — FLUTICASONE PROPIONATE 50 MCG/ACT NA SUSP
2.0000 | Freq: Every day | NASAL | Status: DC
  Administered 2018-04-10 – 2018-04-11 (×2): 2 via NASAL
  Filled 2018-04-09: qty 16

## 2018-04-09 MED ORDER — GUAIFENESIN-DM 100-10 MG/5ML PO SYRP
5.0000 mL | ORAL_SOLUTION | ORAL | Status: DC | PRN
  Administered 2018-04-10: 5 mL via ORAL
  Filled 2018-04-09: qty 5

## 2018-04-09 MED ORDER — SODIUM CHLORIDE 0.9 % IV SOLN
INTRAVENOUS | Status: DC
  Administered 2018-04-10: 06:00:00 via INTRAVENOUS

## 2018-04-09 NOTE — Progress Notes (Signed)
Subjective: No acute events.  Objective: Vital signs in last 24 hours: Temp:  [98.3 F (36.8 C)-98.8 F (37.1 C)] 98.3 F (36.8 C) (01/23 0412) Pulse Rate:  [68-78] 68 (01/23 0412) Resp:  [16-18] 18 (01/23 0412) BP: (114-130)/(44-52) 123/52 (01/23 0412) SpO2:  [89 %-100 %] 95 % (01/23 0412) Weight:  [66 kg] 66 kg (01/23 0412) Last BM Date: 04/07/18  Intake/Output from previous day: 01/22 0701 - 01/23 0700 In: 480 [P.O.:480] Out: 900 [Urine:900] Intake/Output this shift: Total I/O In: -  Out: 400 [Urine:400]  General appearance: sleeping GI: soft, non-tender; bowel sounds normal; no masses,  no organomegaly  Lab Results: Recent Labs    04/08/18 0246 04/08/18 1904 04/09/18 0454  WBC 8.1 8.3 5.9  HGB 7.7* 8.9* 7.8*  HCT 23.7* 27.7* 23.8*  PLT 136* 137* 131*   BMET Recent Labs    04/07/18 1403 04/08/18 0246  NA 136 137  K 4.5 3.9  CL 105 108  CO2 23 24  GLUCOSE 125* 90  BUN 50* 33*  CREATININE 0.90 0.87  CALCIUM 8.7* 8.3*   LFT No results for input(s): PROT, ALBUMIN, AST, ALT, ALKPHOS, BILITOT, BILIDIR, IBILI in the last 72 hours. PT/INR Recent Labs    04/07/18 1403  LABPROT 13.8  INR 1.07   Hepatitis Panel No results for input(s): HEPBSAG, HCVAB, HEPAIGM, HEPBIGM in the last 72 hours. C-Diff No results for input(s): CDIFFTOX in the last 72 hours. Fecal Lactopherrin No results for input(s): FECLLACTOFRN in the last 72 hours.  Studies/Results: Dg Chest Portable 1 View  Result Date: 04/07/2018 CLINICAL DATA:  Uncontrolled blood pressure today EXAM: PORTABLE CHEST 1 VIEW COMPARISON:  Aug 13, 2017 FINDINGS: The mediastinal contour is normal. Heart size is enlarged. Mild patchy opacity right lung base is noted. There is no pulmonary edema or pleural effusion. The lungs are hyperinflated. The visualized skeletal structures are unremarkable. IMPRESSION: Mild patchy opacity right lung base identified, pneumonia is not excluded. Electronically Signed   By:  Abelardo Diesel M.D.   On: 04/07/2018 14:51    Medications:  Scheduled: . citalopram  20 mg Oral Daily  . Melatonin  3 mg Oral QHS  . pantoprazole (PROTONIX) IV  40 mg Intravenous Q12H  . sodium chloride flush  3 mL Intravenous Q12H   Continuous:   Assessment/Plan: 1) Symptomatic anemia. 2) History of NSTEMI.   Cardiology cleared the patient for an EGD.  The patient does not want a colonoscopy.  She is feeling better, per report, with the blood transfusions.  Plan: 1) EGD tomorrow.  LOS: 2 days   Emily Hardin D 04/09/2018, 6:40 AM

## 2018-04-09 NOTE — Progress Notes (Signed)
Patient had an any uneventful night, stable vitals, plan for EGD on 04/10/18

## 2018-04-09 NOTE — Discharge Summary (Signed)
Name: Emily Hardin MRN: [id number] DOB: 12-20-1916 85 y.o. PCP: Shawna Clamp, MD  Date of Admission: 04/07/2018  1:25 PM Date of Discharge: 04/11/18 Attending Physician: Lucious Groves, DO Discharge Diagnosis:  1. Symptomatic anemia 2/2 UGIB 2. Barrett's esophagus  3. HTN  Discharge Medications: Allergies as of 04/11/2018   No Known Allergies     Medication List    TAKE these medications   acetaminophen 325 MG tablet Commonly known as:  TYLENOL Take 650 mg by mouth daily.   amLODipine 5 MG tablet Commonly known as:  NORVASC Take 1 tablet (5 mg total) by mouth daily.   aspirin 81 MG EC tablet Take 1 tablet (81 mg total) by mouth daily.   atenolol 25 MG tablet Commonly known as:  TENORMIN Take 25 mg by mouth daily. Take 0.5 tablet (12.5 mg) in the am and Take 1 tablet (25 mg) in the pm.   BIOFREEZE 4 % Gel Generic drug:  Menthol (Topical Analgesic) Apply 1 application topically at bedtime. To knees   CAL-GEST ANTACID 500 MG chewable tablet Generic drug:  calcium carbonate Chew 1 tablet by mouth daily.   citalopram 20 MG tablet Commonly known as:  CELEXA Take 20 mg by mouth daily.   lisinopril 20 MG tablet Commonly known as:  PRINIVIL,ZESTRIL Take 1 tablet (20 mg total) by mouth daily.   loratadine 10 MG tablet Commonly known as:  CLARITIN Take 10 mg by mouth at bedtime.   Melatonin 3 MG Tabs Take 3 mg by mouth at bedtime.   METAMUCIL FIBER PO Take 1 capsule by mouth daily.   multivitamins with iron Tabs tablet Take 1 tablet by mouth daily.   pantoprazole 40 MG tablet Commonly known as:  PROTONIX Take 1 tablet (40 mg total) by mouth 2 (two) times daily.   VITAMIN A PO Take 2,400 Units by mouth daily.   zolpidem 5 MG tablet Commonly known as:  AMBIEN Take 5 mg by mouth at bedtime as needed for sleep.       Disposition and follow-up:   EmilyEmily Hardin was discharged from [facility] in Good condition.  At the  hospital follow up visit please address:  1.  Symptomatic anemia 2/2 UGIB: Patient presented with Hgb 5.9. She received 2 units pRBCs with resolution in symptoms. Underwent EGD on 1/24 which revealed 2 non-bleeding esophageal ulcers, as well as Barrett's esophagus and a hiatal hernia. She was started on PPI BID which she will continue indefinitely. Due to her age, it is not recommended for her to undergo repeat EGD for surveillance.   2.  Labs / imaging needed at time of follow-up: CBC  3.  Pending labs/ test needing follow-up: none   Follow-up Appointments: Contact information for after-discharge care    Destination    [facility]erred [facility] .   Service:  Skilled Nursing Contact information: [place] [facility] Course by problem list: 1. Acute Blood Loss Anemia 2/2 UGIB: Ms. Emily Hardin is a 85 y/o female with history of anemia with baseline hgb 8-9. She presented from nursing facility with symptoms of orthostatic hypotension and dizziness. Found to be due to symptomatic anemia with a presentation hemoglobin of 5.8. Her labs were also notable for white blood cell count of 13 and BUN of 50 while creatinine was 0.9, other work-up included an FOBT which was positive.  She was transfused  2 units PRBCs, started on Protonix, and admitted to our service for probable upper GI bleed. Hgb stabilized at 7.7 by next day. GI was consulted and an EGD was performed on 1/24. Findings of the EGD revealed evidence of Barrett's esophagus, hiatal hernia and 2 non-bleeding esophageal ulcers without   She was discharged with a stable Hgb at 8.1 with resolution of anemia symptoms.   3. Hypertension: On presentation, her blood pressure was 115/37 most likely as result of ongoing blood loss. Blood pressure remained stable throughout admission, and she was discharged on home anti-hypertensives.   3. Barrett's esophagus: found on EGD; will  continue PPI indefinitely. No indication for surveillance EGD in the future given her age.   4. H/o NSTEMI: aspirin was resumed at discharge per GI recommendations.   Discharge Vitals:   BP (!) 143/66 (BP Location: Right Arm)   Pulse 81   Temp 98 F (36.7 C) (Oral)   Resp 16   Ht 5\' 1"  (1.549 m)   Wt 64.5 kg   SpO2 95%   BMI 26.89 kg/m   Pertinent Labs, Studies, and Procedures:  CBC Latest Ref Rng & Units 04/11/2018 04/10/2018 04/09/2018  WBC 4.0 - 10.5 K/uL 5.2 5.6 7.1  Hemoglobin 12.0 - 15.0 g/dL 8.1(L) 8.2(L) 9.7(L)  Hematocrit 36.0 - 46.0 % 25.2(L) 26.0(L) 30.8(L)  Platelets 150 - 400 K/uL 147(L) 146(L) 154      Discharge Instructions: Discharge Instructions    Call MD for:  difficulty breathing, headache or visual disturbances   Complete by:  As directed    Call MD for:  extreme fatigue   Complete by:  As directed    Call MD for:  persistant dizziness or light-headedness   Complete by:  As directed    Call MD for:  persistant nausea and vomiting   Complete by:  As directed    Call MD for:  temperature >100.4   Complete by:  As directed    Diet - low sodium heart healthy   Complete by:  As directed    Discharge instructions   Complete by:  As directed    Emily Hardin,  It was a pleasure taking care of you! I am glad you are feeling better. You were treated in the hospital for an upper GI bleed. You will continue taking an acid reducer twice a day from now on to reduce the risk of it happening again.  Please continue taking all of your other medications as prescribed, including the aspirin.  I wish you all the best as you continue your recovery at [facility] place!  Dr. Koleen Distance   Increase activity slowly   Complete by:  As directed       Signed: Modena Nunnery D, DO 04/11/2018, 9:15 AM

## 2018-04-09 NOTE — Progress Notes (Addendum)
  Subjective: She is doing well this morning, she said she tossed and turned last night but she was finally able to get some rest early this morning. Otherwise denies any abdominal pain or dizzy spells. Overall she is feeling much stronger and was enjoying all the food she got yesterday.  Objective: Vital signs in last 24 hours: Vitals:   04/08/18 1214 04/08/18 1925 04/08/18 2315 04/09/18 0412  BP: (!) 114/49 (!) 130/44 (!) 114/45 (!) 123/52  Pulse: 72 78 74 68  Resp: 18 16 16 18   Temp: 98.8 F (37.1 C) 98.4 F (36.9 C) 98.4 F (36.9 C) 98.3 F (36.8 C)  TempSrc: Oral Oral Oral Oral  SpO2: 95% (!) 89% 100% 95%  Weight:    66 kg  Height:        General appearance: seen in room sleeping comfortably in bed, cooperative and no distress Lungs: no increased work of breathing Heart: regular rate and rhythm, 2/6 holosystolic murmur at left sternal border Abdomen: soft, non-tender to palpation Extremities: no swelling in extremities, TTP 2/2 to neuropathy  Lab Results: CBC: Hgb 7.8, Hct 23.8, platelets 131 BMP: BUN 15  Assessment/Plan: Principal Problem:   Acute blood loss anemia Active Problems:   Essential hypertension   Symptomatic anemia   1. Symptomatic Anemia: HgB today was 7.8 and BUN down to 15. She is feeling much better and no longer symptomatic. S/p 2 units pRBCs. GI on board and plan for EGD tomorrow. Appreciate cardiology evaluation for clearance. Cont Regular liquid diet today and start NPO at midnight.  - Following GI recommendations - Clear Liquid diet, transition to NPO at midnight - Protonix 40 Mg IV q12 h -Transfuse as needed for Hgb goal of >7   2. HTN: Blood pressures soft at 123/52 , will continue to hold BP medication to avoid further orthostasis or hypotensive episodes.  -hold home dose of amlodipine 5 mg -cont to monitor  3. Hx of NSTEMI: Cardiology completed pre-op evaluation stated she was higher risk for any procedure given her age. However, EGD  fairly low risk overall and reasonable to pursue given symptomatic anemia.   FENGI: Reg liquids diet, NPO at midnight for EGD in morning Code Status: DNR/DNI DVT prophylaxis: SCDs only given possibility of ongoing bleed    LOS: 2 days   Delice Bison, DO 04/09/2018, 10:43 AM  Attestation for Student Documentation:  I personally was present and performed or re-performed the history, physical exam and medical decision-making activities of this service and have verified that the service and findings are accurately documented in the student's note.  Modena Nunnery D, DO 04/09/2018, 10:43 AM

## 2018-04-10 ENCOUNTER — Encounter (HOSPITAL_COMMUNITY): Payer: Self-pay | Admitting: *Deleted

## 2018-04-10 ENCOUNTER — Inpatient Hospital Stay (HOSPITAL_COMMUNITY): Payer: Medicare Other | Admitting: Certified Registered"

## 2018-04-10 ENCOUNTER — Encounter (HOSPITAL_COMMUNITY)
Admission: EM | Disposition: A | Discharge status: Skilled Nursing Facility | Payer: Self-pay | Source: Skilled Nursing Facility | Attending: Internal Medicine

## 2018-04-10 DIAGNOSIS — K449 Diaphragmatic hernia without obstruction or gangrene: Secondary | ICD-10-CM

## 2018-04-10 DIAGNOSIS — K2210 Ulcer of esophagus without bleeding: Secondary | ICD-10-CM | POA: Diagnosis present

## 2018-04-10 DIAGNOSIS — K2270 Barrett's esophagus without dysplasia: Secondary | ICD-10-CM | POA: Diagnosis present

## 2018-04-10 HISTORY — PX: ESOPHAGOGASTRODUODENOSCOPY (EGD) WITH PROPOFOL: SHX5813

## 2018-04-10 LAB — URINALYSIS, ROUTINE W REFLEX MICROSCOPIC
BILIRUBIN URINE: NEGATIVE
GLUCOSE, UA: NEGATIVE mg/dL
HGB URINE DIPSTICK: NEGATIVE
Ketones, ur: NEGATIVE mg/dL
Leukocytes, UA: NEGATIVE
Nitrite: NEGATIVE
Protein, ur: NEGATIVE mg/dL
SPECIFIC GRAVITY, URINE: 1.008 (ref 1.005–1.030)
pH: 7 (ref 5.0–8.0)

## 2018-04-10 LAB — CBC
HCT: 26 % — ABNORMAL LOW (ref 36.0–46.0)
Hemoglobin: 8.2 g/dL — ABNORMAL LOW (ref 12.0–15.0)
MCH: 29.1 pg (ref 26.0–34.0)
MCHC: 31.5 g/dL (ref 30.0–36.0)
MCV: 92.2 fL (ref 80.0–100.0)
Platelets: 146 10*3/uL — ABNORMAL LOW (ref 150–400)
RBC: 2.82 MIL/uL — ABNORMAL LOW (ref 3.87–5.11)
RDW: 17.3 % — ABNORMAL HIGH (ref 11.5–15.5)
WBC: 5.6 10*3/uL (ref 4.0–10.5)
nRBC: 0 % (ref 0.0–0.2)

## 2018-04-10 SURGERY — ESOPHAGOGASTRODUODENOSCOPY (EGD) WITH PROPOFOL
Anesthesia: Monitor Anesthesia Care

## 2018-04-10 MED ORDER — PANTOPRAZOLE SODIUM 40 MG PO TBEC
40.0000 mg | DELAYED_RELEASE_TABLET | Freq: Two times a day (BID) | ORAL | Status: DC
  Administered 2018-04-10 – 2018-04-11 (×3): 40 mg via ORAL
  Filled 2018-04-10 (×3): qty 1

## 2018-04-10 MED ORDER — LACTATED RINGERS IV SOLN
INTRAVENOUS | Status: DC | PRN
  Administered 2018-04-10: 11:00:00 via INTRAVENOUS

## 2018-04-10 MED ORDER — PROPOFOL 500 MG/50ML IV EMUL
INTRAVENOUS | Status: DC | PRN
  Administered 2018-04-10: 100 ug/kg/min via INTRAVENOUS

## 2018-04-10 MED ORDER — LIDOCAINE 2% (20 MG/ML) 5 ML SYRINGE
INTRAMUSCULAR | Status: DC | PRN
  Administered 2018-04-10: 30 mg via INTRAVENOUS

## 2018-04-10 SURGICAL SUPPLY — 14 items

## 2018-04-10 NOTE — Op Note (Signed)
[facility] Patient Name: Emily Hardin Procedure Date : 04/10/2018 MRN: [id number] Attending MD: Carol Ada , MD Date of Birth: [birth year] CSN: [id number] Age: 85 Admit Type: Inpatient Procedure:                Upper GI endoscopy Indications:              Iron deficiency anemia Providers:                Carol Ada, MD, Raynelle Bring, RN, Burtis Junes, RN,                            Cherylynn Ridges, Technician, Sampson Si, CRNA Referring MD:              Medicines:                Propofol per Anesthesia Complications:            No immediate complications. Estimated Blood Loss:     Estimated blood loss: none. Procedure:                Pre-Anesthesia Assessment:                           - Prior to the procedure, a History and Physical                            was performed, and patient medications and                            allergies were reviewed. The patient's tolerance of                            previous anesthesia was also reviewed. The risks                            and benefits of the procedure and the sedation                            options and risks were discussed with the patient.                            All questions were answered, and informed consent                            was obtained. Prior Anticoagulants: The patient has                            taken no previous anticoagulant or antiplatelet                            agents. ASA Grade Assessment: III - A patient with                            severe systemic disease. After reviewing the risks  and benefits, the patient was deemed in                            satisfactory condition to undergo the procedure.                           - Sedation was administered by an anesthesia                            professional. Deep sedation was attained.                           After obtaining informed consent, the endoscope was   passed under direct vision. Throughout the                            procedure, the patient's blood pressure, pulse, and                            oxygen saturations were monitored continuously. The                            GIF-H190 ([id number] Olympus gastroscope was                            introduced through the mouth, and advanced to the                            second part of duodenum. The upper GI endoscopy was                            accomplished without difficulty. The patient                            tolerated the procedure well. Scope In: Scope Out: Findings:      Two superficial esophageal ulcers with no bleeding and no stigmata of       recent bleeding were found in the lower third of the esophagus. The       largest lesion was 5 mm in largest dimension.      Circumferential salmon-colored mucosa was present from 20 to 35 cm. No       other visible abnormalities were present. The maximum longitudinal       extent of these esophageal mucosal changes was 15 cm in length.      A 5 cm hiatal hernia was present.      The stomach was normal.      The examined duodenum was normal.      Two benign distal esophageal ulcers were identified. Both ulcers were       superficial, but the larger proximal ulcer displayed a hemocystic spot.       There was no evidence of bleeding or friability. There were no       suspicious findings to suggest that these ulcerations were malignant. It       was difficult to identify at first, but the patient has the gross       appearance of  a long segment Barrett's esophagus. Impression:               - Non-bleeding esophageal ulcers.                           - Salmon-colored mucosa suspicious for long-segment                            Barrett's esophagus.                           - 5 cm hiatal hernia.                           - Normal stomach.                           - Normal examined duodenum.                           - No specimens  collected. Recommendation:           - Return patient to hospital ward for ongoing care.                           - Resume regular diet.                           - Continue present medications.                           - PPI indefinitely.                           - With her age, it is not recommended that the                            patient undergo repeat examination for the gross                            appearance of Barrett's esophagus.                           - Signing off. Procedure Code(s):        --- Professional ---                           [phone], Esophagogastroduodenoscopy, flexible,                            transoral; diagnostic, including collection of                            specimen(s) by brushing or washing, when performed                            (separate procedure) Diagnosis Code(s):        --- Professional ---  K22.10, Ulcer of esophagus without bleeding                           K22.8, Other specified diseases of esophagus                           K44.9, Diaphragmatic hernia without obstruction or                            gangrene                           D50.9, Iron deficiency anemia, unspecified CPT copyright 2018 American [employer] Association. All rights reserved. The codes documented in this report are preliminary and upon coder review may  be revised to meet current compliance requirements. Carol Ada, MD Carol Ada, MD 04/10/2018 11:26:43 AM This report has been signed electronically. Number of Addenda: 0

## 2018-04-10 NOTE — Anesthesia Postprocedure Evaluation (Signed)
Anesthesia Post Note  Patient: Emily Hardin  Procedure(s) Performed: ESOPHAGOGASTRODUODENOSCOPY (EGD) WITH PROPOFOL (N/A )     Patient location during evaluation: PACU Anesthesia Type: MAC Level of consciousness: awake and alert Pain management: pain level controlled Vital Signs Assessment: post-procedure vital signs reviewed and stable Respiratory status: spontaneous breathing, nonlabored ventilation, respiratory function stable and patient connected to nasal cannula oxygen Cardiovascular status: stable and blood pressure returned to baseline Postop Assessment: no apparent nausea or vomiting Anesthetic complications: no    Last Vitals:  Vitals:   04/10/18 1130 04/10/18 1148  BP: (!) 145/43 (!) 141/50  Pulse: 71 70  Resp: (!) 22 18  Temp:  37.1 C  SpO2: 95% 98%    Last Pain:  Vitals:   04/10/18 1148  TempSrc: Oral  PainSc:                  Tanajah Boulter

## 2018-04-10 NOTE — Progress Notes (Signed)
PT Cancellation Note  Patient Details Name: Emily Hardin MRN: [id number] DOB: 1916-12-26   Cancelled Treatment:    Reason Eval/Treat Not Completed: Patient at procedure or test/unavailable (EGD). Will follow-up for PT treatment as schedule permits.  Mabeline Caras, PT, DPT Acute Rehabilitation Services  Pager [phone] Office [place] 04/10/2018, 9:14 AM

## 2018-04-10 NOTE — Anesthesia Preprocedure Evaluation (Addendum)
Anesthesia Evaluation  Patient identified by MRN, date of birth, ID band Patient awake    Reviewed: Allergy & Precautions, H&P , NPO status , Patient's Chart, lab work & pertinent test results, reviewed documented beta blocker date and time   Airway Mallampati: II  TM Distance: >3 FB Neck ROM: full    Dental no notable dental hx.    Pulmonary pneumonia, former smoker,    Pulmonary exam normal breath sounds clear to auscultation       Cardiovascular Exercise Tolerance: Good hypertension, + CAD and + Past MI   Rhythm:regular Rate:Normal     Neuro/Psych PSYCHIATRIC DISORDERS Depression  Legally blind   negative neurological ROS     GI/Hepatic negative GI ROS, Neg liver ROS,   Endo/Other  negative endocrine ROS  Renal/GU negative Renal ROS  negative genitourinary   Musculoskeletal   Abdominal   Peds  Hematology  (+) Blood dyscrasia, anemia ,   Anesthesia Other Findings   Reproductive/Obstetrics negative OB ROS                             Anesthesia Physical Anesthesia Plan  ASA: III  Anesthesia Plan: MAC   Post-op Pain Management:    Induction: Intravenous  PONV Risk Score and Plan: Treatment may vary due to age or medical condition  Airway Management Planned: Mask, Natural Airway and Nasal Cannula  Additional Equipment:   Intra-op Plan:   Post-operative Plan:   Informed Consent: I have reviewed the patients History and Physical, chart, labs and discussed the procedure including the risks, benefits and alternatives for the proposed anesthesia with the patient or authorized representative who has indicated his/her understanding and acceptance.     Dental Advisory Given  Plan Discussed with: CRNA, Anesthesiologist and Surgeon  Anesthesia Plan Comments:        Anesthesia Quick Evaluation

## 2018-04-10 NOTE — Progress Notes (Signed)
Patient currently in endoscopy for EGD Events of yesterday noted   May need to adjust BP med dosing based on changes in BP WIll sign off   Please call if cardiac questions/ concerns develop.  Dorris Carnes

## 2018-04-10 NOTE — Progress Notes (Addendum)
Physical Therapy Treatment Patient Details Name: Emily Hardin MRN: [id number] DOB: [birth year] Today's Date: 04/10/2018    History of Present Illness Pt is a 85 y.o. female admitted 04/07/18 with acute onset of weakness; blood work revealed Hgb 5.9 g/dL. S/p EGD 1/24. PMH includes NSTEMI, anemia, HTN, legally blind (macular degeneration), HOH.   PT Comments    Pt progressing with mobility. Ambulatory with rollator and min guard for balance; pt with decreased activity tolerance, limited by fatigue; at high risk for falls. SpO2 95% on RA. Pt very pleasant and motivated to regain independence. Continue to recommend SNF-level therapies.   Follow Up Recommendations  SNF;Supervision for mobility/OOB     Equipment Recommendations  None recommended by PT    Recommendations for Other Services       Precautions / Restrictions Precautions Precautions: Fall Precaution Comments: HOH, legally blind (macular degeneration) Restrictions Weight Bearing Restrictions: No    Mobility  Bed Mobility Overal bed mobility: Modified Independent Bed Mobility: Supine to Sit;Sit to Supine           General bed mobility comments: Increased time and effort; no physical assist required  Transfers Overall transfer level: Needs assistance Equipment used: Rolling walker (2 wheeled);4-wheeled walker Transfers: Sit to/from Stand Sit to Stand: Min guard            Ambulation/Gait Ambulation/Gait assistance: Min guard Gait Distance (Feet): 50 Feet(+50) Assistive device: Rolling walker (2 wheeled);4-wheeled walker Gait Pattern/deviations: Step-through pattern;Decreased stride length;Trunk flexed Gait velocity: Decreased Gait velocity interpretation: <1.8 ft/sec, indicate of risk for recurrent falls General Gait Details: Slow, fatigued gait with RW for 50' with min guard for balance; required prolonged seated rest break secondary to fatigue. Amb additional 57' with rollator   Stairs              Wheelchair Mobility    Modified Rankin (Stroke Patients Only)       Balance Overall balance assessment: Needs assistance Sitting-balance support: Feet supported;No upper extremity supported Sitting balance-Leahy Scale: Good     Standing balance support: Bilateral upper extremity supported;During functional activity Standing balance-Leahy Scale: Poor Standing balance comment: Reliant on UE support                            Cognition Arousal/Alertness: Awake/alert Behavior During Therapy: WFL for tasks assessed/performed Overall Cognitive Status: Within Functional Limits for tasks assessed                                 General Comments: some STM deficits noted, likely baseline      Exercises      General Comments General comments (skin integrity, edema, etc.): Daughter present at beginning of session      Pertinent Vitals/Pain Pain Assessment: No/denies pain    Home Living                      Prior Function            PT Goals (current goals can now be found in the care plan section) Acute Rehab PT Goals Patient Stated Goal: eager to return home, but agrees she needs continued rehab at [facility] before returning to [facility] PT Goal Formulation: With patient/family Time For Goal Achievement: 04/22/18 Potential to Achieve Goals: Good Progress towards PT goals: Progressing toward goals    Frequency    Min 2X/week  PT Plan Current plan remains appropriate    Co-evaluation              AM-PAC PT "6 Clicks" Mobility   Outcome Measure  Help needed turning from your back to your side while in a flat bed without using bedrails?: None Help needed moving from lying on your back to sitting on the side of a flat bed without using bedrails?: A Little Help needed moving to and from a bed to a chair (including a wheelchair)?: A Little Help needed standing up from a chair using your arms (e.g., wheelchair or bedside  chair)?: A Little Help needed to walk in hospital room?: A Little Help needed climbing 3-5 steps with a railing? : A Lot 6 Click Score: 18    End of Session Equipment Utilized During Treatment: Gait belt Activity Tolerance: Patient tolerated treatment well;Patient limited by fatigue Patient left: in bed;with call bell/phone within reach Nurse Communication: Mobility status PT Visit Diagnosis: Other abnormalities of gait and mobility (R26.89);Muscle weakness (generalized) (M62.81)     Time: 4037-5436 PT Time Calculation (min) (ACUTE ONLY): 17 min  Charges:  $Gait Training: 8-22 mins                    Mabeline Caras, PT, DPT Acute Rehabilitation Services  Pager [phone] Office [place] 04/10/2018, 3:06 PM

## 2018-04-10 NOTE — Plan of Care (Signed)
  Problem: Education: Goal: Knowledge of General Education information will improve Description Including pain rating scale, medication(s)/side effects and non-pharmacologic comfort measures Outcome: Adequate for Discharge   Problem: Clinical Measurements: Goal: Ability to maintain clinical measurements within normal limits will improve Outcome: Adequate for Discharge   Problem: Clinical Measurements: Goal: Will remain free from infection Outcome: Adequate for Discharge   Problem: Clinical Measurements: Goal: Diagnostic test results will improve Outcome: Adequate for Discharge   Problem: Safety: Goal: Ability to remain free from injury will improve Outcome: Adequate for Discharge

## 2018-04-10 NOTE — Progress Notes (Signed)
   Subjective: Emily Hardin was seen and evaluated at bedside. No acute events overnight. She rested well. Denies any abdominal pain, n/v, chest pain, or lightheadedness. She has not had a BM since admission. She will go for EGD later this morning.   Objective:  Vital signs in last 24 hours: Vitals:   04/10/18 1119 04/10/18 1120 04/10/18 1130 04/10/18 1148  BP:  (!) 128/35 (!) 145/43 (!) 141/50  Pulse:  76 71 70  Resp:  (!) 23 (!) 22 18  Temp:  97.9 F (36.6 C)  98.7 F (37.1 C)  TempSrc: Oral Oral  Oral  SpO2:  97% 95% 98%  Weight:      Height:       General: awake, alert, lying in bed in NAD CV: RRR; 2/6 SEM at left sternal border Abd: BS+; abdomen is soft, non-tender, non-distended  Assessment/Plan:  Principal Problem:   Acute blood loss anemia Active Problems:   Essential hypertension   Symptomatic anemia  1. Symptomatic anemia 2/2 esophageal ulcers - Hgb stable at 8.2 - Appreciate GI consult. She had EGD today which showed 2 esophageal ulcers that were not actively bleeding, as well as evidence of Barrett's esophagus  - she will continue PPI indefinitely - advancing diet and if hgb remains stable overnight she will likely return to [facility] tomorrow   2. HTN - blood pressures are stable - will resume Amlodipine at discharge   Dispo: Anticipated discharge in approximately 1 day(s).   Modena Nunnery D, DO 04/10/2018, 12:06 PM Pager: [phone]

## 2018-04-10 NOTE — Transfer of Care (Signed)
Immediate Anesthesia Transfer of Care Note  Patient: Emily Hardin  Procedure(s) Performed: ESOPHAGOGASTRODUODENOSCOPY (EGD) WITH PROPOFOL (N/A )  Patient Location: Endoscopy Unit  Anesthesia Type:MAC  Level of Consciousness: awake  Airway & Oxygen Therapy: Patient Spontanous Breathing  Post-op Assessment: Report given to RN  Post vital signs: Reviewed and stable  Last Vitals:  Vitals Value Taken Time  BP 128/35 04/10/2018 11:20 AM  Temp 36.6 C 04/10/2018 11:20 AM  Pulse 70 04/10/2018 11:21 AM  Resp 26 04/10/2018 11:21 AM  SpO2 96 % 04/10/2018 11:21 AM  Vitals shown include unvalidated device data.  Last Pain:  Vitals:   04/10/18 1120  TempSrc: Oral  PainSc: 0-No pain         Complications: No apparent anesthesia complications

## 2018-04-11 LAB — CBC
HEMATOCRIT: 25.2 % — AB (ref 36.0–46.0)
Hemoglobin: 8.1 g/dL — ABNORMAL LOW (ref 12.0–15.0)
MCH: 29.9 pg (ref 26.0–34.0)
MCHC: 32.1 g/dL (ref 30.0–36.0)
MCV: 93 fL (ref 80.0–100.0)
Platelets: 147 10*3/uL — ABNORMAL LOW (ref 150–400)
RBC: 2.71 MIL/uL — ABNORMAL LOW (ref 3.87–5.11)
RDW: 16.6 % — AB (ref 11.5–15.5)
WBC: 5.2 10*3/uL (ref 4.0–10.5)
nRBC: 0 % (ref 0.0–0.2)

## 2018-04-11 MED ORDER — PANTOPRAZOLE SODIUM 40 MG PO TBEC: 40.0000 mg | DELAYED_RELEASE_TABLET | Freq: Two times a day (BID) | ORAL | 0 refills | Status: AC

## 2018-04-11 NOTE — Progress Notes (Signed)
  Date: 04/11/2018  Patient name: Emily Hardin  Medical record number: [id number]  Date of birth: 1916-07-11   This patient's plan of care was discussed with the house staff. Please see Dr. Janne Napoleon note for complete details. I concur with her findings.   Sid Falcon, MD 04/11/2018, 9:16 PM

## 2018-04-11 NOTE — Clinical Social Work Placement (Signed)
   CLINICAL SOCIAL WORK PLACEMENT  NOTE  Date:  04/11/2018  Patient Details  Name: Emily Hardin MRN: [id number] Date of Birth: [birth year]  Clinical Social Work is seeking post-discharge placement for this patient at the [place] level of care (*CSW will initial, date and re-position this form in  chart as items are completed):  Yes   Patient/family provided with [facility] Work Department's list of facilities offering this level of care within the geographic area requested by the patient (or if unable, by the patient's family).  Yes   Patient/family informed of their freedom to choose among providers that offer the needed level of care, that participate in Medicare, Medicaid or managed care program needed by the patient, have an available bed and are willing to accept the patient.  Yes   Patient/family informed of [facility]'s ownership interest in [facility] and [facility], as well as of the fact that they are under no obligation to receive care at these facilities.  PASRR submitted to EDS on       PASRR number received on 04/08/18     Existing PASRR number confirmed on       FL2 transmitted to all facilities in geographic area requested by pt/family on       FL2 transmitted to all facilities within larger geographic area on       Patient informed that his/her managed care company has contracts with or will negotiate with certain facilities, including the following:        Yes   Patient/family informed of bed offers received.  Patient chooses bed at [facility]     Physician recommends and patient chooses bed at      Patient to be transferred to [facility] on  .  Patient to be transferred to facility by [facility]     Patient family notified on 04/11/18 of transfer.  Name of family member notified:  [place], daughter     PHYSICIAN       Additional Comment:    _______________________________________________ Vinie Sill, [place] 04/11/2018, 11:10 AM

## 2018-04-11 NOTE — Progress Notes (Signed)
Patient will DC to: [facility] Date: 04/11/2018 Family Notified: Caryl Pina, granddaughter Transport By: Corey Harold at [employer]  RN, patient, and facility notified of DC. Discharge Summary sent to facility. RN given number for [phone], Room 908([facility]). DC packet on chart. Ambulance transport requested for patient.   Clinical Social Worker signing off. Thurmond Butts, [place] Social Worker [phone]

## 2018-04-11 NOTE — Progress Notes (Signed)
   Subjective: Emily Hardin was seen and evaluated at bedside on morning rounds. No acute events overnight. Her EGD went well yesterday. She understands she will be on PPI indefinitely. No lightheadedness, chest pain, abdominal pain, n/v. She looks forward to going back to [facility] to continue rehab.   Objective:  Vital signs in last 24 hours: Vitals:   04/10/18 1130 04/10/18 1148 04/10/18 1946 04/11/18 0544  BP: (!) 145/43 (!) 141/50 (!) 134/55 (!) 143/66  Pulse: 71 70 89 81  Resp: (!) 22 18 18 16   Temp:  98.7 F (37.1 C) 98.4 F (36.9 C) 98 F (36.7 C)  TempSrc:  Oral Oral Oral  SpO2: 95% 98% 96% 95%  Weight:      Height:       General: awake, alert, pleasant female lying in bed in NAD CV: RRR; 2/6 SEM at left sternal border Abd: BS+; abdomen is soft, non-tender, non-distended   Assessment/Plan:  Principal Problem:   Acute blood loss anemia Active Problems:   Essential hypertension   Symptomatic anemia   Hiatal hernia   Barrett's esophagus   Esophageal ulcer without bleeding  1. Symptomatic anemia 2/2 esophageal ulcers - Hgb stable at 8.1 - Appreciate GI consult. S/p EGD 1/24 which showed 2 esophageal ulcers that were not actively bleeding, as well as evidence of Barrett's esophagus and hiatal hernia  - she will continue PPI indefinitely - tolerating PO well and is asymptomatic - stable for discharge to continue rehab at [facility]   2. HTN - blood pressures are stable - will resume home anti-hypertensives at discharge  3. H/o NSTEMI: will resume aspirin at discharge per GI recommendations.    Dispo: Patient is medically stable for discharge to [facility].   Modena Nunnery D, DO 04/11/2018, 9:16 AM Pager: [phone]

## 2018-04-12 ENCOUNTER — Encounter (HOSPITAL_COMMUNITY): Payer: Self-pay | Admitting: Gastroenterology

## 2018-05-22 DIAGNOSIS — L603 Nail dystrophy: Secondary | ICD-10-CM | POA: Insufficient documentation

## 2018-05-22 DIAGNOSIS — M2041 Other hammer toe(s) (acquired), right foot: Secondary | ICD-10-CM | POA: Insufficient documentation

## 2018-05-22 DIAGNOSIS — M2042 Other hammer toe(s) (acquired), left foot: Secondary | ICD-10-CM | POA: Insufficient documentation

## 2018-07-03 ENCOUNTER — Other Ambulatory Visit: Payer: Self-pay

## 2018-07-03 ENCOUNTER — Emergency Department (HOSPITAL_BASED_OUTPATIENT_CLINIC_OR_DEPARTMENT_OTHER)
Admission: EM | Admit: 2018-07-03 | Discharge: 2018-07-03 | Disposition: A | Discharge status: Home / Self Care | Payer: Medicare Other | Attending: Emergency Medicine | Admitting: Emergency Medicine

## 2018-07-03 ENCOUNTER — Encounter (HOSPITAL_BASED_OUTPATIENT_CLINIC_OR_DEPARTMENT_OTHER): Payer: Self-pay | Admitting: Emergency Medicine

## 2018-07-03 DIAGNOSIS — I10 Essential (primary) hypertension: Secondary | ICD-10-CM | POA: Diagnosis not present

## 2018-07-03 DIAGNOSIS — Y929 Unspecified place or not applicable: Secondary | ICD-10-CM | POA: Diagnosis not present

## 2018-07-03 DIAGNOSIS — S0591XA Unspecified injury of right eye and orbit, initial encounter: Secondary | ICD-10-CM | POA: Diagnosis present

## 2018-07-03 DIAGNOSIS — H548 Legal blindness, as defined in USA: Secondary | ICD-10-CM | POA: Insufficient documentation

## 2018-07-03 DIAGNOSIS — F17200 Nicotine dependence, unspecified, uncomplicated: Secondary | ICD-10-CM | POA: Diagnosis not present

## 2018-07-03 DIAGNOSIS — Y939 Activity, unspecified: Secondary | ICD-10-CM | POA: Insufficient documentation

## 2018-07-03 DIAGNOSIS — Z79899 Other long term (current) drug therapy: Secondary | ICD-10-CM | POA: Insufficient documentation

## 2018-07-03 DIAGNOSIS — S00211A Abrasion of right eyelid and periocular area, initial encounter: Secondary | ICD-10-CM | POA: Diagnosis not present

## 2018-07-03 DIAGNOSIS — X58XXXA Exposure to other specified factors, initial encounter: Secondary | ICD-10-CM | POA: Insufficient documentation

## 2018-07-03 DIAGNOSIS — Y999 Unspecified external cause status: Secondary | ICD-10-CM | POA: Insufficient documentation

## 2018-07-03 DIAGNOSIS — Z7982 Long term (current) use of aspirin: Secondary | ICD-10-CM | POA: Diagnosis not present

## 2018-07-03 MED ORDER — TETRACAINE HCL 0.5 % OP SOLN
2.0000 [drp] | Freq: Once | OPHTHALMIC | Status: AC
  Administered 2018-07-03: 17:00:00 2 [drp] via OPHTHALMIC
  Filled 2018-07-03: qty 4

## 2018-07-03 MED ORDER — FLUORESCEIN SODIUM 1 MG OP STRP
1.0000 | ORAL_STRIP | Freq: Once | OPHTHALMIC | Status: AC
  Administered 2018-07-03: 1 via OPHTHALMIC
  Filled 2018-07-03: qty 1

## 2018-07-03 MED ORDER — POLYMYXIN B-TRIMETHOPRIM 10000-0.1 UNIT/ML-% OP SOLN
2.0000 [drp] | Freq: Four times a day (QID) | OPHTHALMIC | Status: DC
  Administered 2018-07-03: 18:00:00 2 [drp] via OPHTHALMIC
  Filled 2018-07-03: qty 10

## 2018-07-03 NOTE — ED Provider Notes (Signed)
[facility] EMERGENCY DEPARTMENT Provider Note   CSN: [id number] Arrival date & time: 07/03/18  1711    History   Chief Complaint Chief Complaint  Patient presents with  . Eye Problem    HPI Emily Hardin is a 85 y.o. female history of hypertension, legally blind here presenting with bleeding from the right lower eyelid.  Patient states that she took a shower around 1 PM and was doing well.  Around 3 PM she noticed some blood in the right lower eyelid.  States that she may have rubbed her eyes but does not remember.  She lives in an assisted living currently.  Any fevers or chills or sick contacts. In particular, she has no known COVID contacts. She denies worsening blurry vision but states that she is legally blind so doesn't see well at baseline.      The history is provided by the patient.    Past Medical History:  Diagnosis Date  . Aspiration pneumonia ([place])   . Cancer ([place])   . Chest pain 08/14/2017  . Depression   . Encephalopathy   . HOH (hard of hearing)   . Hypertension   . Legally blind     Patient Active Problem List   Diagnosis Date Noted  . Hiatal hernia 04/10/2018  . Barrett's esophagus 04/10/2018  . Esophageal ulcer without bleeding 04/10/2018  . Acute blood loss anemia 04/08/2018  . Symptomatic anemia 04/07/2018  . Chest pain 08/13/2017  . Hyponatremia 08/13/2017  . Essential hypertension 07/26/2017  . Primary insomnia 07/26/2017  . HTN (hypertension) 06/09/2017  . Depression 06/09/2017    Past Surgical History:  Procedure Laterality Date  . ABDOMINAL HYSTERECTOMY    . CESAREAN SECTION    . ESOPHAGOGASTRODUODENOSCOPY (EGD) WITH PROPOFOL N/A 04/10/2018   Procedure: ESOPHAGOGASTRODUODENOSCOPY (EGD) WITH PROPOFOL;  Surgeon: Carol Ada, MD;  Location: [facility];  Service: Endoscopy;  Laterality: N/A;  . SHOULDER SURGERY       OB History   No obstetric history on file.      Home Medications    Prior to Admission  medications   Medication Sig Start Date End Date Taking? Authorizing Provider  acetaminophen (TYLENOL) 325 MG tablet Take 650 mg by mouth daily.     [provider]  amLODipine (NORVASC) 5 MG tablet Take 1 tablet (5 mg total) by mouth daily. 11/07/17 04/07/18  Revankar, Reita Cliche, MD  aspirin EC 81 MG EC tablet Take 1 tablet (81 mg total) by mouth daily. 08/16/17   Mikhail, Velta Addison, DO  atenolol (TENORMIN) 25 MG tablet Take 25 mg by mouth daily. Take 0.5 tablet (12.5 mg) in the am and Take 1 tablet (25 mg) in the pm.    [provider]  calcium carbonate (CAL-GEST ANTACID) 500 MG chewable tablet Chew 1 tablet by mouth daily.     [provider]  citalopram (CELEXA) 20 MG tablet Take 20 mg by mouth daily.     [provider]  lisinopril (PRINIVIL,ZESTRIL) 20 MG tablet Take 1 tablet (20 mg total) by mouth daily. 08/16/17   Mikhail, Velta Addison, DO  loratadine (CLARITIN) 10 MG tablet Take 10 mg by mouth at bedtime.    [provider]  Melatonin 3 MG TABS Take 3 mg by mouth at bedtime.    [provider]  Menthol, Topical Analgesic, (BIOFREEZE) 4 % GEL Apply 1 application topically at bedtime. To knees    [provider]  Multiple Vitamins-Iron (MULTIVITAMINS WITH IRON) TABS tablet Take 1  tablet by mouth daily.    [provider]  pantoprazole (PROTONIX) 40 MG tablet Take 1 tablet (40 mg total) by mouth 2 (two) times daily. 04/11/18   [place], Carley D, DO  Psyllium (METAMUCIL FIBER PO) Take 1 capsule by mouth daily.    [provider]  VITAMIN A PO Take 2,400 Units by mouth daily.     [provider]  zolpidem (AMBIEN) 5 MG tablet Take 5 mg by mouth at bedtime as needed for sleep.     [provider]    Family History Family History  Problem Relation Age of Onset  . Obesity Daughter   . Heart attack Father     Social History Social History   Tobacco Use  . Smoking status: Former Smoker    Last attempt  to quit: 06/10/1987    Years since quitting: 31.0  . Smokeless tobacco: Never Used  Substance Use Topics  . Alcohol use: Yes    Alcohol/week: 1.0 standard drinks    Types: 1 Glasses of wine per week    Comment: 1 glass wine daily  . Drug use: Never     Allergies   Patient has no known allergies.   Review of Systems Review of Systems  Eyes: Positive for pain.  All other systems reviewed and are negative.    Physical Exam Updated Vital Signs BP (!) 141/52 (BP Location: Right Arm)   Pulse 81   Temp 98.4 F (36.9 C) (Oral)   Resp 16   SpO2 99%   Physical Exam Vitals signs and nursing note reviewed.  HENT:     Head: Normocephalic.     Nose: Nose normal.     Mouth/Throat:     Mouth: Mucous membranes are moist.  Eyes:     Comments: R lower eyelid swollen, no obvious laceration. On fluorescein stain, there is abrasion R lower eyelid. No corneal abrasion. Extra ocular movements intact. No hyphema   Neck:     Musculoskeletal: Normal range of motion.  Cardiovascular:     Rate and Rhythm: Normal rate.     Pulses: Normal pulses.  Pulmonary:     Effort: Pulmonary effort is normal.  Abdominal:     General: Abdomen is flat.  Musculoskeletal: Normal range of motion.  Skin:    General: Skin is warm.     Capillary Refill: Capillary refill takes less than 2 seconds.  Neurological:     General: No focal deficit present.     Mental Status: She is alert.  Psychiatric:        Mood and Affect: Mood normal.      ED Treatments / Results  Labs (all labs ordered are listed, but only abnormal results are displayed) Labs Reviewed - No data to display  EKG None  Radiology No results found.  Procedures Procedures (including critical care time)  Medications Ordered in ED Medications  fluorescein ophthalmic strip 1 strip (has no administration in time range)  trimethoprim-polymyxin b (POLYTRIM) ophthalmic solution 2 drop (has no administration in time range)  tetracaine  (PONTOCAINE) 0.5 % ophthalmic solution 2 drop (2 drops Right Eye Given 07/03/18 1727)     Initial Impression / Assessment and Plan / ED Course  I have reviewed the triage vital signs and the nursing notes.  Pertinent labs & imaging results that were available during my care of the patient were reviewed by me and considered in my medical decision making (see chart for details).  Emily Hardin is a 85 y.o. female here with R lower eyelid swelling and bleeding. There is an abrasion of the R lower eyelid. No obvious laceration. No corneal abrasion. I think that is causing her bleeding. No signs of periorbital or orbital cellulitis. Will give polytrim empirically to prevent infection. No need for oral abx for now. Stable for discharge back to facility.    Final Clinical Impressions(s) / ED Diagnoses   Final diagnoses:  Eyelid abrasion, right, initial encounter    ED Discharge Orders    None       Drenda Freeze, MD 07/03/18 1742

## 2018-07-03 NOTE — ED Triage Notes (Signed)
Reports right eye bleeding and swelling which began today.  Denies injury.

## 2018-07-03 NOTE — Discharge Instructions (Addendum)
You have a scratch on your right lower eyelid.   Please use polytrim drops three times daily for 3 days. This will help prevent infection.   Avoid rubbing your eye.   See your doctor. Consider follow up with eye doctor in a week if the eyelid is still swollen   Return to ER if you have worse eyelid swelling, fever, worse eye redness

## 2018-07-03 NOTE — ED Notes (Signed)
ED Provider at bedside. 

## 2018-08-19 ENCOUNTER — Other Ambulatory Visit: Payer: Self-pay

## 2018-08-19 ENCOUNTER — Telehealth: Payer: Self-pay | Admitting: Cardiology

## 2018-08-19 ENCOUNTER — Encounter: Payer: Self-pay | Admitting: Cardiology

## 2018-08-19 ENCOUNTER — Telehealth (INDEPENDENT_AMBULATORY_CARE_PROVIDER_SITE_OTHER): Payer: Medicare Other | Admitting: Cardiology

## 2018-08-19 VITALS — BP 129/73 | Ht 61.0 in | Wt 151.0 lb

## 2018-08-19 DIAGNOSIS — I10 Essential (primary) hypertension: Secondary | ICD-10-CM | POA: Diagnosis not present

## 2018-08-19 DIAGNOSIS — I340 Nonrheumatic mitral (valve) insufficiency: Secondary | ICD-10-CM

## 2018-08-19 NOTE — Telephone Encounter (Signed)
Pls evaluate for how soon pt should be scheduled and if she should be seen in office or virtually.     pls advise, tx

## 2018-08-19 NOTE — Progress Notes (Signed)
Virtual Visit via Video Note   This visit type was conducted due to national recommendations for restrictions regarding the COVID-19 Pandemic (e.g. social distancing) in an effort to limit this patient's exposure and mitigate transmission in our community.  Due to her co-morbid illnesses, this patient is at least at moderate risk for complications without adequate follow up.  This format is felt to be most appropriate for this patient at this time.  All issues noted in this document were discussed and addressed.  A limited physical exam was performed with this format.  Please refer to the patient's chart for her consent to telehealth for [facility]   Date:  08/19/2018   ID:  Emily Hardin, DOB [birth year], MRN [id number]  Patient Location: Home Provider Location: Home  PCP:  Shawna Clamp, MD  Cardiologist:  Jenean Lindau, MD  Electrophysiologist:  None   Evaluation Performed:  Follow-Up Visit  Chief Complaint:  dyspnea  History of Present Illness:    Emily Hardin is a 85 y.o. female with past medical history of essential hypertension and moderate mitral regurgitation.  The patient's family called [place] saying that the patient is having some shortness of breath and pedal edema.  A diuretic furosemide 20 mg was initiated and the patient feels some better.  At the time of my evaluation, the patient is alert awake oriented and in no distress.  The patient does not have symptoms concerning for COVID-19 infection (fever, chills, cough, or new shortness of breath).    Past Medical History:  Diagnosis Date  . Aspiration pneumonia ([place])   . Cancer ([place])   . Chest pain 08/14/2017  . Depression   . Encephalopathy   . HOH (hard of hearing)   . Hypertension   . Legally blind    Past Surgical History:  Procedure Laterality Date  . ABDOMINAL HYSTERECTOMY    . CESAREAN SECTION    . ESOPHAGOGASTRODUODENOSCOPY (EGD) WITH PROPOFOL N/A 04/10/2018   Procedure:  ESOPHAGOGASTRODUODENOSCOPY (EGD) WITH PROPOFOL;  Surgeon: Carol Ada, MD;  Location: [facility];  Service: Endoscopy;  Laterality: N/A;  . SHOULDER SURGERY       Current Meds  Medication Sig  . acetaminophen (TYLENOL) 325 MG tablet Take 650 mg by mouth daily.   Marland Kitchen atenolol (TENORMIN) 25 MG tablet Take 25 mg by mouth daily. Take 0.5 tablet (12.5 mg) in the am and Take 1 tablet (25 mg) in the pm.  . calcium carbonate (CAL-GEST ANTACID) 500 MG chewable tablet Chew 1 tablet by mouth daily.   . citalopram (CELEXA) 20 MG tablet Take 20 mg by mouth daily.   . furosemide (LASIX) 40 MG tablet Take 0.5 tablets by mouth daily.  Marland Kitchen lisinopril (PRINIVIL,ZESTRIL) 20 MG tablet Take 1 tablet (20 mg total) by mouth daily.  Marland Kitchen loratadine (CLARITIN) 10 MG tablet Take 10 mg by mouth at bedtime.  . Melatonin 3 MG TABS Take 3 mg by mouth at bedtime.  . Menthol, Topical Analgesic, (BIOFREEZE) 4 % GEL Apply 1 application topically at bedtime. To knees  . Multiple Vitamins-Iron (MULTIVITAMINS WITH IRON) TABS tablet Take 1 tablet by mouth daily.  . pantoprazole (PROTONIX) 40 MG tablet Take 1 tablet (40 mg total) by mouth 2 (two) times daily.  . Psyllium (METAMUCIL FIBER PO) Take 1 capsule by mouth daily.  Marland Kitchen zolpidem (AMBIEN) 5 MG tablet Take 5 mg by mouth at bedtime as needed for sleep.   . [DISCONTINUED] VITAMIN A PO Take 2,400 Units by mouth daily.  Allergies:   Eggs or egg-derived products; Oatmeal; and Latex   Social History   Tobacco Use  . Smoking status: Former Smoker    Last attempt to quit: 06/10/1987    Years since quitting: 31.2  . Smokeless tobacco: Never Used  Substance Use Topics  . Alcohol use: Yes    Alcohol/week: 1.0 standard drinks    Types: 1 Glasses of wine per week    Comment: 1 glass wine daily  . Drug use: Never     Family Hx: The patient's family history includes Heart attack in her father; Obesity in her daughter.  ROS:   Please see the history of present illness.     As above All other systems reviewed and are negative.   Prior CV studies:   The following studies were reviewed today:  As mentioned above  Labs/Other Tests and Data Reviewed:    EKG:  No ECG reviewed.  Recent Labs: 04/09/2018: BUN 15; Creatinine, Ser 0.71; Potassium 3.8; Sodium 136 04/11/2018: Hemoglobin 8.1; Platelets 147   Recent Lipid Panel Lab Results  Component Value Date/Time   CHOL 159 08/15/2017 03:27 AM   TRIG 50 08/15/2017 03:27 AM   HDL 62 08/15/2017 03:27 AM   CHOLHDL 2.6 08/15/2017 03:27 AM   LDLCALC 87 08/15/2017 03:27 AM    Wt Readings from Last 3 Encounters:  08/19/18 151 lb (68.5 kg)  04/10/18 142 lb 4.8 oz (64.5 kg)  11/07/17 149 lb (67.6 kg)     Objective:    Vital Signs:  BP 129/73 (BP Location: Left Arm, Patient Position: Sitting, Cuff Size: Normal)   Ht 5\' 1"  (1.549 m)   Wt 151 lb (68.5 kg)   BMI 28.53 kg/m    VITAL SIGNS:  reviewed  ASSESSMENT & PLAN:    1. Shortness of breath and history suggestive of congestive heart failure: The patient is feeling better already on 20 mg of Lasix.  I have made the following interventions.  Diet was discussed and salt intake issues were discussed.  I have asked the patient to hold amlodipine and lisinopril for tomorrow and day after.  I would like her to take 40 mg of the Lasix every day and I would like to do a follow-up visit on Friday by tele-visit.  Extra dietary potassium supplementation was suggested to her family member and they vocalized understanding. 2. Essential hypertension: Blood pressure is stable 3. Mitral regurgitation: Echocardiogram report was discussed with the patient.  She knows to go to the nearest emergency room for any significant symptoms.  COVID-19 Education: The signs and symptoms of COVID-19 were discussed with the patient and how to seek care for testing (follow up with PCP or arrange E-visit).  The importance of social distancing was discussed today.  Time:   Today, I have  spent 15 minutes with the patient with telehealth technology discussing the above problems.     Medication Adjustments/Labs and Tests Ordered: Current medicines are reviewed at length with the patient today.  Concerns regarding medicines are outlined above.   Tests Ordered: No orders of the defined types were placed in this encounter.   Medication Changes: No orders of the defined types were placed in this encounter.   Disposition:  Follow up this friday  Signed, Jenean Lindau, MD  08/19/2018 12:14 PM    [place]

## 2018-08-19 NOTE — Telephone Encounter (Signed)
Can do video call today  And if not possible for them, tomorrow is fine. If he gets worse, needs to got to ER

## 2018-08-19 NOTE — Patient Instructions (Signed)
Medication Instructions:  Your physician has recommended you make the following change in your medication:   INCREASE furosemide to 40 mg (1 tablet) once daily  HOLD lisinopril and amlodipine until 08/21/2018.  If you need a refill on your cardiac medications before your next appointment, please call your pharmacy.   Lab work: NONE If you have labs (blood work) drawn today and your tests are completely normal, you will receive your results only by: Marland Kitchen MyChart Message (if you have MyChart) OR . A paper copy in the mail If you have any lab test that is abnormal or we need to change your treatment, we will call you to review the results.  Testing/Procedures: NONE  Follow-Up: At [facility], you and your health needs are our priority.  As part of our continuing mission to provide you with exceptional heart care, we have created designated Provider Care Teams.  These Care Teams include your primary Cardiologist (physician) and Advanced Practice Providers (APPs -  Physician Assistants and Nurse Practitioners) who all work together to provide you with the care you need, when you need it. You will need a follow up appointment in 2 days.

## 2018-08-19 NOTE — Telephone Encounter (Signed)
New Message     Emily Hardin is calling and said the [facility] wants the pt to follow up with cardiology because she has is holding a lot of fluid    Please call

## 2018-08-19 NOTE — Telephone Encounter (Signed)
RN called patients granddaughter Caryl Pina) and received phone consent for telemedicine visit. Patient instructed to have home BP cuff available and all medication out for reconciliation/refills. Patient scheduled for virtual visit and had no further questions.

## 2018-08-21 ENCOUNTER — Telehealth (INDEPENDENT_AMBULATORY_CARE_PROVIDER_SITE_OTHER): Payer: Medicare Other | Admitting: Cardiology

## 2018-08-21 ENCOUNTER — Encounter: Payer: Self-pay | Admitting: Cardiology

## 2018-08-21 ENCOUNTER — Other Ambulatory Visit: Payer: Self-pay

## 2018-08-21 VITALS — BP 114/64 | HR 63 | Ht 61.0 in | Wt 150.0 lb

## 2018-08-21 DIAGNOSIS — I10 Essential (primary) hypertension: Secondary | ICD-10-CM

## 2018-08-21 DIAGNOSIS — I340 Nonrheumatic mitral (valve) insufficiency: Secondary | ICD-10-CM | POA: Diagnosis not present

## 2018-08-21 NOTE — Addendum Note (Signed)
Addended by: Beckey Rutter on: 08/21/2018 12:46 PM   Modules accepted: Orders

## 2018-08-21 NOTE — Patient Instructions (Signed)
Medication Instructions:  STOP taking amlodipine DECREASE lasix to 20 mg (0.5 tablet) every other day If you need a refill on your cardiac medications before your next appointment, please call your pharmacy.   Lab work: Patient will have BMP drawn at PCP office and results sent to Dr. Geraldo Pitter next week.   If you have labs (blood work) drawn today and your tests are completely normal, you will receive your results only by: Marland Kitchen MyChart Message (if you have MyChart) OR . A paper copy in the mail If you have any lab test that is abnormal or we need to change your treatment, we will call you to review the results.  Testing/Procedures: NONE  Follow-Up: At [facility], you and your health needs are our priority.  As part of our continuing mission to provide you with exceptional heart care, we have created designated Provider Care Teams.  These Care Teams include your primary Cardiologist (physician) and Advanced Practice Providers (APPs -  Physician Assistants and Nurse Practitioners) who all work together to provide you with the care you need, when you need it. You will need a follow up appointment in 2 weeks.

## 2018-08-21 NOTE — Progress Notes (Signed)
Virtual Visit via Video Note   This visit type was conducted due to national recommendations for restrictions regarding the COVID-19 Pandemic (e.g. social distancing) in an effort to limit this patient's exposure and mitigate transmission in our community.  Due to her co-morbid illnesses, this patient is at least at moderate risk for complications without adequate follow up.  This format is felt to be most appropriate for this patient at this time.  All issues noted in this document were discussed and addressed.  A limited physical exam was performed with this format.  Please refer to the patient's chart for her consent to telehealth for [facility].   Date:  08/21/2018   ID:  Emily Hardin, DOB 1916/12/30, MRN [id number]  Patient Location: Home Provider Location: Home  PCP:  Shawna Clamp, MD  Cardiologist:  Jenean Lindau, MD  Electrophysiologist:  None   Evaluation Performed:  Follow-Up Visit  Chief Complaint: Moderate mitral regurgitation and congestive heart failure  History of Present Illness:    Emily Hardin is a 85 y.o. female with past medical history of moderate mitral regurgitation and essential hypertension.  She was having symptoms of fluid overload.  Diuretic seems to have worked well for her.  She feels much better and her pedal edema is now gone.  She and her daughter is very happy about it.  At the time of my evaluation, the patient is alert awake oriented and in no distress.  The patient does not have symptoms concerning for COVID-19 infection (fever, chills, cough, or new shortness of breath).    Past Medical History:  Diagnosis Date  . Aspiration pneumonia ([place])   . Cancer ([place])   . Chest pain 08/14/2017  . Depression   . Encephalopathy   . HOH (hard of hearing)   . Hypertension   . Legally blind    Past Surgical History:  Procedure Laterality Date  . ABDOMINAL HYSTERECTOMY    . CESAREAN SECTION    . ESOPHAGOGASTRODUODENOSCOPY (EGD)  WITH PROPOFOL N/A 04/10/2018   Procedure: ESOPHAGOGASTRODUODENOSCOPY (EGD) WITH PROPOFOL;  Surgeon: Carol Ada, MD;  Location: [facility];  Service: Endoscopy;  Laterality: N/A;  . SHOULDER SURGERY       Current Meds  Medication Sig  . acetaminophen (TYLENOL) 325 MG tablet Take 650 mg by mouth daily.   Marland Kitchen atenolol (TENORMIN) 25 MG tablet Take 25 mg by mouth daily. Take 0.5 tablet (12.5 mg) in the am and Take 1 tablet (25 mg) in the pm.  . calcium carbonate (CAL-GEST ANTACID) 500 MG chewable tablet Chew 1 tablet by mouth daily.   . citalopram (CELEXA) 20 MG tablet Take 20 mg by mouth daily.   . furosemide (LASIX) 40 MG tablet Take 1 tablet by mouth daily.  Marland Kitchen lisinopril (PRINIVIL,ZESTRIL) 20 MG tablet Take 1 tablet (20 mg total) by mouth daily.  Marland Kitchen loratadine (CLARITIN) 10 MG tablet Take 10 mg by mouth at bedtime.  . Melatonin 3 MG TABS Take 3 mg by mouth at bedtime.  . Menthol, Topical Analgesic, (BIOFREEZE) 4 % GEL Apply 1 application topically at bedtime. To knees  . Multiple Vitamins-Iron (MULTIVITAMINS WITH IRON) TABS tablet Take 1 tablet by mouth daily.  . pantoprazole (PROTONIX) 40 MG tablet Take 1 tablet (40 mg total) by mouth 2 (two) times daily.  . Psyllium (METAMUCIL FIBER PO) Take 1 capsule by mouth daily.  Marland Kitchen zolpidem (AMBIEN) 5 MG tablet Take 5 mg by mouth at bedtime as needed for sleep.  Allergies:   Eggs or egg-derived products; Oatmeal; and Latex   Social History   Tobacco Use  . Smoking status: Former Smoker    Last attempt to quit: 06/10/1987    Years since quitting: 31.2  . Smokeless tobacco: Never Used  Substance Use Topics  . Alcohol use: Yes    Alcohol/week: 1.0 standard drinks    Types: 1 Glasses of wine per week    Comment: 1 glass wine daily  . Drug use: Never     Family Hx: The patient's family history includes Heart attack in her father; Obesity in her daughter.  ROS:   Please see the history of present illness.    As mentioned above All  other systems reviewed and are negative.   Prior CV studies:   The following studies were reviewed today:  Results of echocardiogram were discussed with the patient  Labs/Other Tests and Data Reviewed:    EKG:  No ECG reviewed.  Recent Labs: 04/09/2018: BUN 15; Creatinine, Ser 0.71; Potassium 3.8; Sodium 136 04/11/2018: Hemoglobin 8.1; Platelets 147   Recent Lipid Panel Lab Results  Component Value Date/Time   CHOL 159 08/15/2017 03:27 AM   TRIG 50 08/15/2017 03:27 AM   HDL 62 08/15/2017 03:27 AM   CHOLHDL 2.6 08/15/2017 03:27 AM   LDLCALC 87 08/15/2017 03:27 AM    Wt Readings from Last 3 Encounters:  08/21/18 150 lb (68 kg)  08/19/18 151 lb (68.5 kg)  04/10/18 142 lb 4.8 oz (64.5 kg)     Objective:    Vital Signs:  BP 114/64 (BP Location: Left Arm, Patient Position: Sitting, Cuff Size: Normal)   Pulse 63   Ht 5\' 1"  (1.549 m)   Wt 150 lb (68 kg)   BMI 28.34 kg/m    VITAL SIGNS:  reviewed  ASSESSMENT & PLAN:    1. Fluid overload/congestive heart failure.  This is a patient with moderate mitral regurgitation.  Currently she is feeling much better with diuretic therapy.  I made the following changes in her medications now.  I told her to resume lisinopril 20 mg daily she will stop amlodipine.  She will take 20 mg of Lasix every other day.  She will go to her primary care doctor's office and will have a Chem-7 in the next few days. 2. Essential hypertension: Blood pressure stable 3. Moderate mitral regurgitation: We will continue to monitor.  Patient and daughter had multiple questions which were answered to their satisfaction.  COVID-19 Education: The signs and symptoms of COVID-19 were discussed with the patient and how to seek care for testing (follow up with PCP or arrange E-visit).  The importance of social distancing was discussed today.  Time:   Today, I have spent 15 minutes with the patient with telehealth technology discussing the above problems.      Medication Adjustments/Labs and Tests Ordered: Current medicines are reviewed at length with the patient today.  Concerns regarding medicines are outlined above.   Tests Ordered: No orders of the defined types were placed in this encounter.   Medication Changes: No orders of the defined types were placed in this encounter.   Disposition:  Follow up in 2 week(s)  Signed, Jenean Lindau, MD  08/21/2018 11:38 AM    [place]

## 2018-09-07 ENCOUNTER — Other Ambulatory Visit: Payer: Self-pay

## 2018-09-07 ENCOUNTER — Telehealth: Payer: Medicare Other | Admitting: Cardiology

## 2018-09-07 DIAGNOSIS — Z7409 Other reduced mobility: Secondary | ICD-10-CM | POA: Insufficient documentation

## 2018-09-08 ENCOUNTER — Encounter: Payer: Self-pay | Admitting: Cardiology

## 2018-09-08 ENCOUNTER — Telehealth (INDEPENDENT_AMBULATORY_CARE_PROVIDER_SITE_OTHER): Payer: Medicare Other | Admitting: Cardiology

## 2018-09-08 VITALS — BP 117/73 | HR 62 | Ht 61.0 in | Wt 150.0 lb

## 2018-09-08 DIAGNOSIS — I10 Essential (primary) hypertension: Secondary | ICD-10-CM | POA: Diagnosis not present

## 2018-09-08 DIAGNOSIS — I340 Nonrheumatic mitral (valve) insufficiency: Secondary | ICD-10-CM

## 2018-09-08 NOTE — Progress Notes (Signed)
Virtual Visit via Video Note   This visit type was conducted due to national recommendations for restrictions regarding the COVID-19 Pandemic (e.g. social distancing) in an effort to limit this patient's exposure and mitigate transmission in our community.  Due to her co-morbid illnesses, this patient is at least at moderate risk for complications without adequate follow up.  This format is felt to be most appropriate for this patient at this time.  All issues noted in this document were discussed and addressed.  A limited physical exam was performed with this format.  Please refer to the patient's chart for her consent to telehealth for [facility].   Date:  09/08/2018   ID:  Emily Hardin, DOB 08/05/1916, MRN [id number]  Patient Location: Home Provider Location: Office  PCP:  Shawna Clamp, MD  Cardiologist:  Jenean Lindau, MD   Electrophysiologist:  None   Evaluation Performed:  Follow-Up Visit  Chief Complaint: Moderate mitral regurgitation and volume overload  History of Present Illness:    Emily Hardin is a 85 y.o. female with past medical history of essential hypertension.  Recently echocardiogram revealed moderate mitral regurgitation.  She was found to be with fluid overload and this was treated with diuretic therapy and she feels remarkably better.  She feels happy today by video conference.  She has a very supportive family.  She denies any chest pain orthopnea or PND and gives history only of mild pedal edema.  At the time of my evaluation, the patient is alert awake oriented and in no distress.  The patient does not have symptoms concerning for COVID-19 infection (fever, chills, cough, or new shortness of breath).    Past Medical History:  Diagnosis Date  . Aspiration pneumonia ([place])   . Cancer ([place])   . Chest pain 08/14/2017  . Depression   . Encephalopathy   . HOH (hard of hearing)   . Hypertension   . Legally blind    Past Surgical History:   Procedure Laterality Date  . ABDOMINAL HYSTERECTOMY    . CESAREAN SECTION    . ESOPHAGOGASTRODUODENOSCOPY (EGD) WITH PROPOFOL N/A 04/10/2018   Procedure: ESOPHAGOGASTRODUODENOSCOPY (EGD) WITH PROPOFOL;  Surgeon: Carol Ada, MD;  Location: [place];  Service: Endoscopy;  Laterality: N/A;  . SHOULDER SURGERY       Current Meds  Medication Sig  . acetaminophen (TYLENOL) 325 MG tablet Take 650 mg by mouth daily.   Marland Kitchen atenolol (TENORMIN) 25 MG tablet Take 25 mg by mouth daily. Take 0.5 tablet (12.5 mg) in the am and Take 1 tablet (25 mg) in the pm.  . calcium carbonate (CAL-GEST ANTACID) 500 MG chewable tablet Chew 1 tablet by mouth daily.   . citalopram (CELEXA) 20 MG tablet Take 20 mg by mouth daily.   . furosemide (LASIX) 40 MG tablet Take 0.5 tablets by mouth every other day.  . lisinopril (PRINIVIL,ZESTRIL) 20 MG tablet Take 1 tablet (20 mg total) by mouth daily.  Marland Kitchen loratadine (CLARITIN) 10 MG tablet Take 10 mg by mouth at bedtime.  . Melatonin 3 MG TABS Take 3 mg by mouth at bedtime.  . Menthol, Topical Analgesic, (BIOFREEZE) 4 % GEL Apply 1 application topically at bedtime. To knees  . Multiple Vitamins-Iron (MULTIVITAMINS WITH IRON) TABS tablet Take 1 tablet by mouth daily.  . pantoprazole (PROTONIX) 40 MG tablet Take 1 tablet (40 mg total) by mouth 2 (two) times daily. (Patient taking differently: Take 40 mg by mouth daily. )  . Psyllium (METAMUCIL FIBER  PO) Take 1 capsule by mouth daily.  . vitamin C (ASCORBIC ACID) 250 MG tablet Take 500 mg by mouth daily.  Marland Kitchen zolpidem (AMBIEN) 5 MG tablet Take 5 mg by mouth at bedtime as needed for sleep.   . [DISCONTINUED] Multiple Vitamin (MULTI-VITAMIN) tablet Take by mouth.     Allergies:   Eggs or egg-derived products, Oatmeal, and Latex   Social History   Tobacco Use  . Smoking status: Former Smoker    Quit date: 06/10/1987    Years since quitting: 31.2  . Smokeless tobacco: Never Used  Substance Use Topics  . Alcohol use: Yes     Alcohol/week: 1.0 standard drinks    Types: 1 Glasses of wine per week    Comment: 1 glass wine daily  . Drug use: Never     Family Hx: The patient's family history includes Heart attack in her father; Obesity in her daughter.  ROS:   Please see the history of present illness.    As mentioned above All other systems reviewed and are negative.   Prior CV studies:   The following studies were reviewed today:  Echocardiogram report reviewed with patient  Labs/Other Tests and Data Reviewed:    EKG:  No ECG reviewed.  Recent Labs: 04/09/2018: BUN 15; Creatinine, Ser 0.71; Potassium 3.8; Sodium 136 04/11/2018: Hemoglobin 8.1; Platelets 147   Recent Lipid Panel Lab Results  Component Value Date/Time   CHOL 159 08/15/2017 03:27 AM   TRIG 50 08/15/2017 03:27 AM   HDL 62 08/15/2017 03:27 AM   CHOLHDL 2.6 08/15/2017 03:27 AM   LDLCALC 87 08/15/2017 03:27 AM    Wt Readings from Last 3 Encounters:  09/08/18 150 lb (68 kg)  08/21/18 150 lb (68 kg)  08/19/18 151 lb (68.5 kg)     Objective:    Vital Signs:  BP 117/73 (BP Location: Left Arm, Patient Position: Sitting, Cuff Size: Normal)   Pulse 62   Ht 5\' 1"  (1.549 m)   Wt 150 lb (68 kg)   BMI 28.34 kg/m    VITAL SIGNS:  reviewed  ASSESSMENT & PLAN:    1. Essential hypertension: Blood pressure stable.  Diet was discussed with the patient 2. Moderate mitral regurgitation: Asymptomatic and medical therapy at this time. 3. In view of the fact that she is on multiple medications we will get a Chem-7 in the next few days. 4. She will be seen in follow-up appointment in a month or earlier if she has any concerns.  COVID-19 Education: The signs and symptoms of COVID-19 were discussed with the patient and how to seek care for testing (follow up with PCP or arrange E-visit).  The importance of social distancing was discussed today.  Time:   Today, I have spent 15 minutes with the patient with telehealth technology discussing  the above problems.     Medication Adjustments/Labs and Tests Ordered: Current medicines are reviewed at length with the patient today.  Concerns regarding medicines are outlined above.   Tests Ordered: No orders of the defined types were placed in this encounter.   Medication Changes: No orders of the defined types were placed in this encounter.   Follow Up:  Virtual Visit or In Person in 1 month(s)  Signed, Jenean Lindau, MD  09/08/2018 12:11 PM    [place];

## 2018-09-08 NOTE — Addendum Note (Signed)
Addended by: Beckey Rutter on: 09/08/2018 12:32 PM   Modules accepted: Orders

## 2018-09-08 NOTE — Patient Instructions (Addendum)
Medication Instructions:  Your physician recommends that you continue on your current medications as directed. Please refer to the Current Medication list given to you today.  If you need a refill on your cardiac medications before your next appointment, please call your pharmacy.   Lab work: Your physician recommends that you return BMP to be drawn  If you have labs (blood work) drawn today and your tests are completely normal, you will receive your results only by: Marland Kitchen MyChart Message (if you have MyChart) OR . A paper copy in the mail If you have any lab test that is abnormal or we need to change your treatment, we will call you to review the results.  Testing/Procedures: NONE  Follow-Up: At [facility], you and your health needs are our priority.  As part of our continuing mission to provide you with exceptional heart care, we have created designated Provider Care Teams.  These Care Teams include your primary Cardiologist (physician) and Advanced Practice Providers (APPs -  Physician Assistants and Nurse Practitioners) who all work together to provide you with the care you need, when you need it.

## 2018-09-12 LAB — BASIC METABOLIC PANEL
BUN/Creatinine Ratio: 20 (ref 12–28)
BUN: 16 mg/dL (ref 10–36)
CO2: 24 mmol/L (ref 20–29)
Calcium: 9 mg/dL (ref 8.7–10.3)
Chloride: 97 mmol/L (ref 96–106)
Creatinine, Ser: 0.81 mg/dL (ref 0.57–1.00)
GFR calc Af Amer: 69 mL/min/{1.73_m2} (ref >59)
GFR calc non Af Amer: 60 mL/min/{1.73_m2} (ref >59)
Glucose: 105 mg/dL — ABNORMAL HIGH (ref 65–99)
Potassium: 4.5 mmol/L (ref 3.5–5.2)
Sodium: 137 mmol/L (ref 134–144)

## 2018-09-15 ENCOUNTER — Telehealth: Payer: Self-pay

## 2018-09-15 NOTE — Telephone Encounter (Signed)
-----   Message from Jenean Lindau, MD sent at 09/14/2018  8:03 AM EDT ----- The results of the study is unremarkable. Please inform patient. I will discuss in detail at next appointment. Cc  primary care/referring physician Jenean Lindau, MD 09/14/2018 8:02 AM

## 2018-09-15 NOTE — Telephone Encounter (Signed)
Called pt gdaughter Caryl Pina but no vm setup. Copy of results sent to Dr. Dwyane Dee per Dr. Docia Furl request.

## 2018-10-20 ENCOUNTER — Telehealth: Payer: Medicare Other | Admitting: Cardiology

## 2018-10-20 ENCOUNTER — Other Ambulatory Visit: Payer: Self-pay

## 2019-06-18 ENCOUNTER — Telehealth: Payer: Self-pay | Admitting: Cardiology

## 2019-06-18 NOTE — Telephone Encounter (Signed)
New Message     Pts granddaughter says she will need to assist the pt to her appt because she uses a wheel chair    Please advise

## 2019-06-18 NOTE — Telephone Encounter (Signed)
Advised patient's granddaughter that she will be bale to accompany the patient to her appointment on Monday.

## 2019-06-21 ENCOUNTER — Encounter: Payer: Self-pay | Admitting: Cardiology

## 2019-06-21 ENCOUNTER — Other Ambulatory Visit: Payer: Self-pay

## 2019-06-21 ENCOUNTER — Ambulatory Visit (INDEPENDENT_AMBULATORY_CARE_PROVIDER_SITE_OTHER): Payer: Medicare Other | Admitting: Cardiology

## 2019-06-21 VITALS — BP 100/72 | HR 57 | Ht 61.0 in | Wt 152.0 lb

## 2019-06-21 DIAGNOSIS — I5043 Acute on chronic combined systolic (congestive) and diastolic (congestive) heart failure: Secondary | ICD-10-CM | POA: Diagnosis not present

## 2019-06-21 DIAGNOSIS — I340 Nonrheumatic mitral (valve) insufficiency: Secondary | ICD-10-CM | POA: Diagnosis not present

## 2019-06-21 DIAGNOSIS — E871 Hypo-osmolality and hyponatremia: Secondary | ICD-10-CM

## 2019-06-21 DIAGNOSIS — I10 Essential (primary) hypertension: Secondary | ICD-10-CM | POA: Diagnosis not present

## 2019-06-21 NOTE — Progress Notes (Signed)
Cardiology Office Note:    Date:  06/21/2019   ID:  Emily Hardin, DOB [birth year], MRN [id number]  PCP:  Shawna Clamp, MD  Cardiologist:  Jenean Lindau, MD   Referring MD: Shawna Clamp, MD    ASSESSMENT:    1. Essential hypertension   2. Moderate mitral regurgitation    PLAN:    In order of problems listed above:  1. Primary prevention stressed with patient.  Importance of compliance with diet and medication stressed and she vocalized understanding 2. Moderate mitral regurgitation: Stable at this time.  Continue current meds issues. 3. Congestive heart failure: Stable.  Diet was discussed.  She will continue current medications.  I will not change any medications to her blood pressure is borderline.  She is asymptomatic this has helped her congestive heart failure treatment. 4. Patient will be seen in follow-up appointment in 6 months or earlier if the patient has any concerns 5. She will have blood work namely Chem-7 and BNP as baseline today.  Patient's granddaughter had multiple questions which were answered to her satisfaction.   Medication Adjustments/Labs and Tests Ordered: Current medicines are reviewed at length with the patient today.  Concerns regarding medicines are outlined above.  No orders of the defined types were placed in this encounter.  No orders of the defined types were placed in this encounter.    No chief complaint on file.    History of Present Illness:    Emily Hardin is a 86 y.o. female.  Patient has past medical history of essential hypertension and moderate mitral regurgitation.  She has had congestive heart failure in the past and is on low-dose diuretic therapy.  She denies any problems at this time and takes care of activities of daily living.  She lives in assisted living and her granddaughter brings a for the session.  She is mentally very alert awake and oriented.  She denies any chest pain orthopnea or edema.  She is in a  wheelchair.  At the time of my evaluation, the patient is alert awake oriented and in no distress.  Past Medical History:  Diagnosis Date  . Aspiration pneumonia ([place])   . Cancer ([place])   . Chest pain 08/14/2017  . Depression   . Encephalopathy   . HOH (hard of hearing)   . Hypertension   . Legally blind     Past Surgical History:  Procedure Laterality Date  . ABDOMINAL HYSTERECTOMY    . CESAREAN SECTION    . ESOPHAGOGASTRODUODENOSCOPY (EGD) WITH PROPOFOL N/A 04/10/2018   Procedure: ESOPHAGOGASTRODUODENOSCOPY (EGD) WITH PROPOFOL;  Surgeon: Carol Ada, MD;  Location: [place];  Service: Endoscopy;  Laterality: N/A;  . SHOULDER SURGERY      Current Medications: Current Meds  Medication Sig  . acetaminophen (TYLENOL) 325 MG tablet Take 650 mg by mouth daily.   Marland Kitchen atenolol (TENORMIN) 25 MG tablet Take 25 mg by mouth daily. Take 0.5 tablet (12.5 mg) in the am and Take 1 tablet (25 mg) in the pm.  . calcium carbonate (CAL-GEST ANTACID) 500 MG chewable tablet Chew 1 tablet by mouth daily.   . citalopram (CELEXA) 20 MG tablet Take 20 mg by mouth daily.   . furosemide (LASIX) 40 MG tablet Take 0.5 tablets by mouth every other day.  . lisinopril (PRINIVIL,ZESTRIL) 20 MG tablet Take 1 tablet (20 mg total) by mouth daily.  Marland Kitchen loratadine (CLARITIN) 10 MG tablet Take 10 mg by mouth at bedtime.  . Melatonin 3 MG TABS  Take 3 mg by mouth at bedtime.  . Menthol, Topical Analgesic, (BIOFREEZE) 4 % GEL Apply 1 application topically at bedtime. To knees  . Multiple Vitamins-Iron (MULTIVITAMINS WITH IRON) TABS tablet Take 1 tablet by mouth daily.  . pantoprazole (PROTONIX) 40 MG tablet Take 1 tablet (40 mg total) by mouth 2 (two) times daily. (Patient taking differently: Take 40 mg by mouth daily. )  . Psyllium (METAMUCIL FIBER PO) Take 1 capsule by mouth daily.  . vitamin C (ASCORBIC ACID) 250 MG tablet Take 500 mg by mouth daily.  Marland Kitchen zolpidem (AMBIEN) 5 MG tablet Take 5 mg by mouth at bedtime as  needed for sleep.      Allergies:   Eggs or egg-derived products, Oatmeal, and Latex   Social History   Socioeconomic History  . Marital status: Widowed    Spouse name: Not on file  . Number of children: Not on file  . Years of education: Not on file  . Highest education level: Not on file  Occupational History  . Not on file  Tobacco Use  . Smoking status: Former Smoker    Quit date: 06/10/1987    Years since quitting: 32.0  . Smokeless tobacco: Never Used  Substance and Sexual Activity  . Alcohol use: Yes    Alcohol/week: 1.0 standard drinks    Types: 1 Glasses of wine per week    Comment: 1 glass wine daily  . Drug use: Never  . Sexual activity: Not on file  Other Topics Concern  . Not on file  Social History Narrative  . Not on file   Social Determinants of Health   Financial Resource Strain:   . Difficulty of Paying Living Expenses:   Food Insecurity:   . Worried About Charity fundraiser in the Last Year:   . Arboriculturist in the Last Year:   Transportation Needs:   . Film/video editor (Medical):   Marland Kitchen Lack of Transportation (Non-Medical):   Physical Activity:   . Days of Exercise per Week:   . Minutes of Exercise per Session:   Stress:   . Feeling of Stress :   Social Connections:   . Frequency of Communication with Friends and Family:   . Frequency of Social Gatherings with Friends and Family:   . Attends Religious Services:   . Active Member of Clubs or Organizations:   . Attends Archivist Meetings:   Marland Kitchen Marital Status:      Family History: The patient's family history includes Heart attack in her father; Obesity in her daughter.  ROS:   Please see the history of present illness.    All other systems reviewed and are negative.  EKGs/Labs/Other Studies Reviewed:    The following studies were reviewed today: EKG reveals sinus rhythm first-degree AV block and nonspecific ST-T changes   Recent Labs: 09/11/2018: BUN 16;  Creatinine, Ser 0.81; Potassium 4.5; Sodium 137  Recent Lipid Panel    Component Value Date/Time   CHOL 159 08/15/2017 0327   TRIG 50 08/15/2017 0327   HDL 62 08/15/2017 0327   CHOLHDL 2.6 08/15/2017 0327   VLDL 10 08/15/2017 0327   LDLCALC 87 08/15/2017 0327    Physical Exam:    VS:  BP 100/72   Pulse (!) 57   Ht 5\' 1"  (1.549 m)   Wt 152 lb (68.9 kg) Comment: pt gave last known weight  SpO2 94%   BMI 28.72 kg/m     Wt  Readings from Last 3 Encounters:  06/21/19 152 lb (68.9 kg)  09/08/18 150 lb (68 kg)  08/21/18 150 lb (68 kg)     GEN: Patient is in no acute distress HEENT: Normal NECK: No JVD; No carotid bruits LYMPHATICS: No lymphadenopathy CARDIAC: Hear sounds regular, 2/6 systolic murmur at the apex. RESPIRATORY:  Clear to auscultation without rales, wheezing or rhonchi  ABDOMEN: Soft, non-tender, non-distended MUSCULOSKELETAL:  No edema; No deformity  SKIN: Warm and dry NEUROLOGIC:  Alert and oriented x 3 PSYCHIATRIC:  Normal affect   Signed, Jenean Lindau, MD  06/21/2019 2:51 PM    [facility]

## 2019-06-21 NOTE — Patient Instructions (Signed)
Medication Instructions:  No medication changes *If you need a refill on your cardiac medications before your next appointment, please call your pharmacy*   Lab Work: Your physician recommends that you have a BMET and BNP today. If you have labs (blood work) drawn today and your tests are completely normal, you will receive your results only by: Marland Kitchen MyChart Message (if you have MyChart) OR . A paper copy in the mail If you have any lab test that is abnormal or we need to change your treatment, we will call you to review the results.   Testing/Procedures: None ordered   Follow-Up: At [facility], you and your health needs are our priority.  As part of our continuing mission to provide you with exceptional heart care, we have created designated Provider Care Teams.  These Care Teams include your primary Cardiologist (physician) and Advanced Practice Providers (APPs -  Physician Assistants and Nurse Practitioners) who all work together to provide you with the care you need, when you need it.  We recommend signing up for the patient portal called "MyChart".  Sign up information is provided on this After Visit Summary.  MyChart is used to connect with patients for Virtual Visits (Telemedicine).  Patients are able to view lab/test results, encounter notes, upcoming appointments, etc.  Non-urgent messages can be sent to your provider as well.   To learn more about what you can do with MyChart, go to [email or web address].    Your next appointment:   6 month(s)  The format for your next appointment:   In Person  Provider:   Jyl Heinz, MD   Other Instructions NA

## 2019-06-22 LAB — BASIC METABOLIC PANEL
BUN/Creatinine Ratio: 15 (ref 12–28)
BUN: 13 mg/dL (ref 10–36)
CO2: 27 mmol/L (ref 20–29)
Calcium: 10 mg/dL (ref 8.7–10.3)
Chloride: 97 mmol/L (ref 96–106)
Creatinine, Ser: 0.87 mg/dL (ref 0.57–1.00)
GFR calc Af Amer: 63 mL/min/{1.73_m2} (ref >59)
GFR calc non Af Amer: 55 mL/min/{1.73_m2} — ABNORMAL LOW (ref >59)
Glucose: 105 mg/dL — ABNORMAL HIGH (ref 65–99)
Potassium: 4 mmol/L (ref 3.5–5.2)
Sodium: 137 mmol/L (ref 134–144)

## 2019-06-22 LAB — BRAIN NATRIURETIC PEPTIDE: BNP: 145.4 pg/mL — ABNORMAL HIGH (ref 0.0–100.0)

## 2019-11-17 DEATH — deceased

## 2020-04-10 IMAGING — DX DG CHEST 1V PORT
1 series · 1 of 1 positions shown · non-contrast
Comparison: August 13, 2017

CLINICAL DATA: Uncontrolled blood pressure today

EXAM:
PORTABLE CHEST 1 VIEW

[chest]
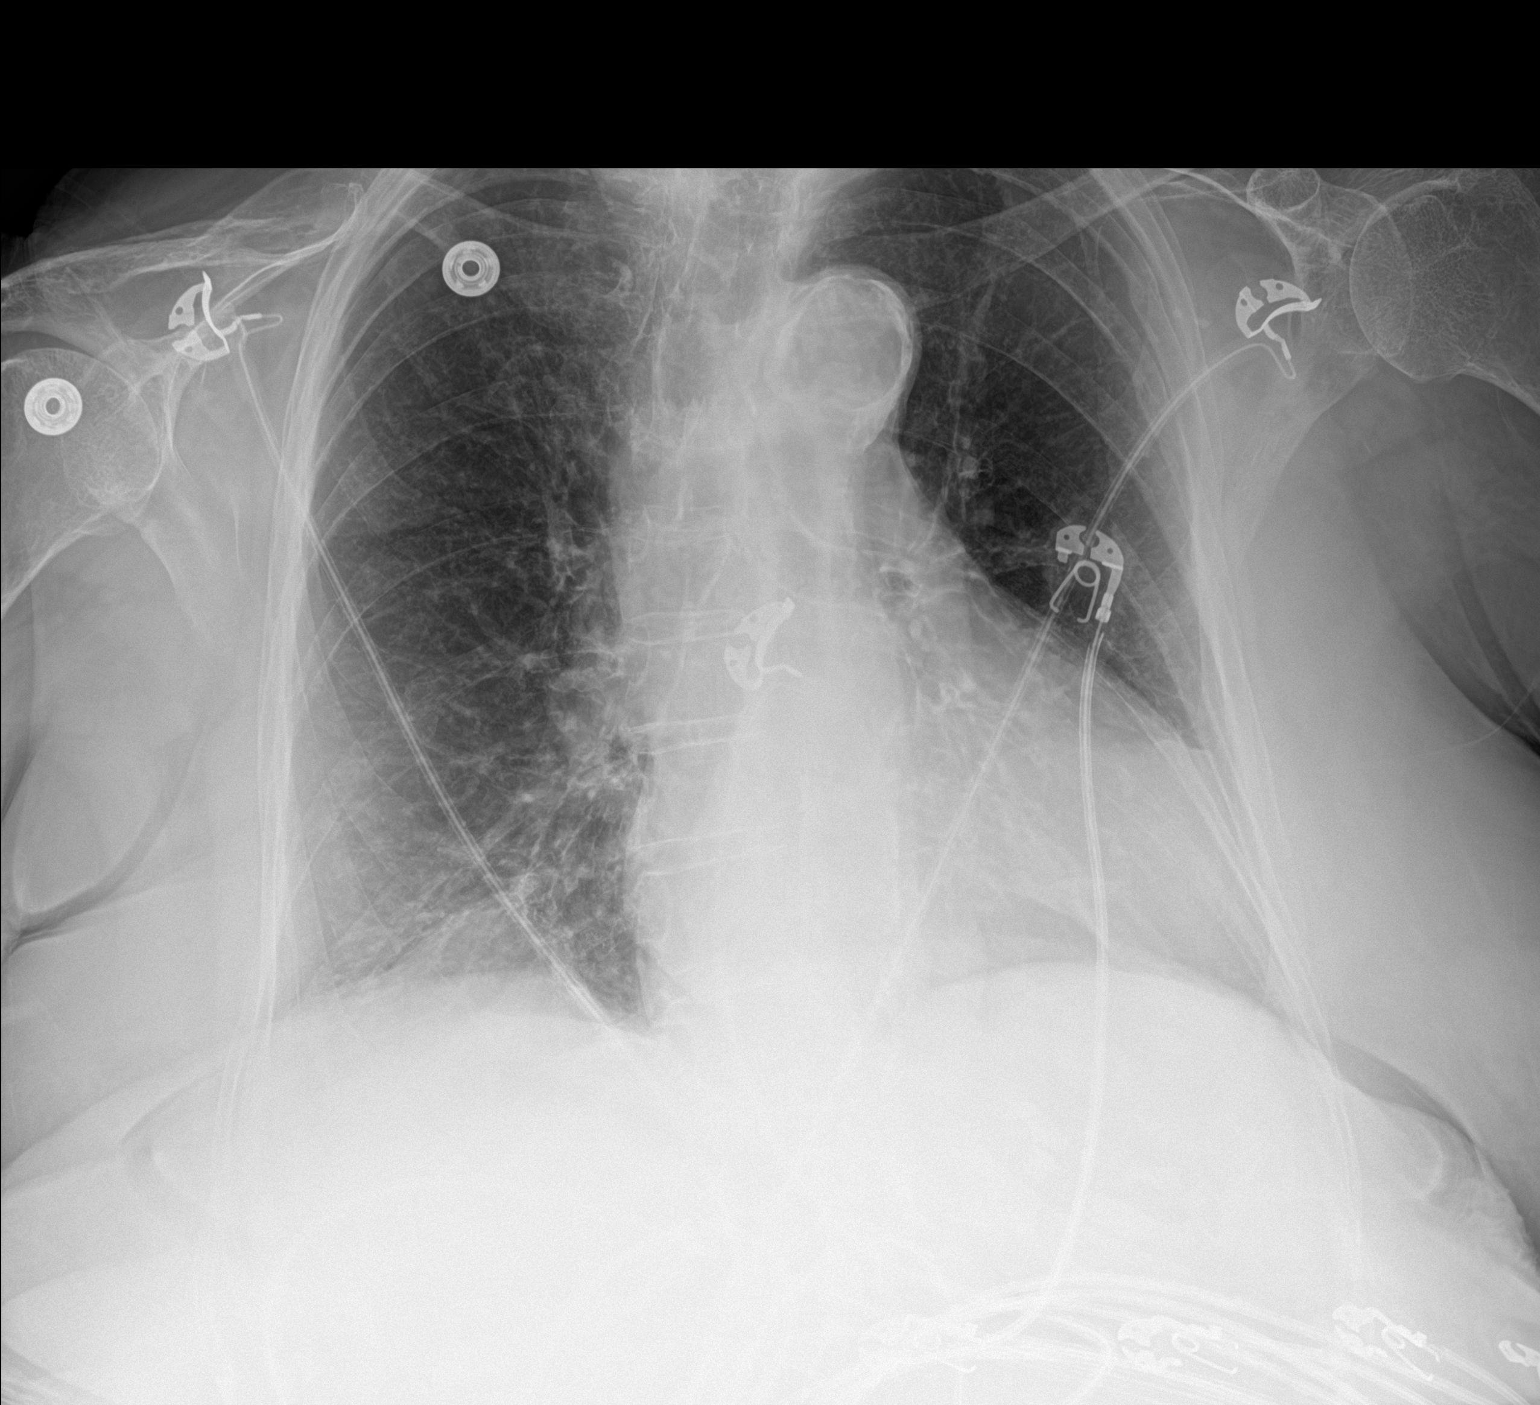

[1 of 1 positions shown; findings below may reference images not displayed]

FINDINGS: The mediastinal contour is normal. Heart size is enlarged. Mild
patchy opacity right lung base is noted. There is no pulmonary edema
or pleural effusion. The lungs are hyperinflated. The visualized
skeletal structures are unremarkable.
IMPRESSION: Mild patchy opacity right lung base identified, pneumonia is not
excluded.
